# Patient Record
Sex: Male | Born: 1950 | Race: White | Hispanic: No | Marital: Married | Smoking: Former smoker
Health system: Southern US, Community
[De-identification: ages and names within clinical notes are randomized; demographics above are authoritative.]

## PROBLEM LIST (undated history)

## (undated) DIAGNOSIS — K8020 Calculus of gallbladder without cholecystitis without obstruction: Secondary | ICD-10-CM

## (undated) DIAGNOSIS — Z860101 Personal history of adenomatous and serrated colon polyps: Secondary | ICD-10-CM

## (undated) DIAGNOSIS — Z8619 Personal history of other infectious and parasitic diseases: Secondary | ICD-10-CM

## (undated) DIAGNOSIS — M1990 Unspecified osteoarthritis, unspecified site: Secondary | ICD-10-CM

## (undated) DIAGNOSIS — K219 Gastro-esophageal reflux disease without esophagitis: Secondary | ICD-10-CM

## (undated) DIAGNOSIS — Z86010 Personal history of colonic polyps: Secondary | ICD-10-CM

## (undated) DIAGNOSIS — Z8719 Personal history of other diseases of the digestive system: Secondary | ICD-10-CM

## (undated) DIAGNOSIS — Z87440 Personal history of urinary (tract) infections: Secondary | ICD-10-CM

## (undated) DIAGNOSIS — K449 Diaphragmatic hernia without obstruction or gangrene: Secondary | ICD-10-CM

## (undated) HISTORY — DX: Gastro-esophageal reflux disease without esophagitis: K21.9

## (undated) HISTORY — PX: COLONOSCOPY WITH ESOPHAGOGASTRODUODENOSCOPY (EGD): SHX5779

---
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
## 1987-07-02 HISTORY — PX: CERVICAL DISC SURGERY: SHX588

## 2013-11-17 ENCOUNTER — Emergency Department (HOSPITAL_COMMUNITY)
Admission: EM | Admit: 2013-11-17 | Discharge: 2013-11-17 | Disposition: A | Discharge status: Home / Self Care | Payer: BC Managed Care – PPO | Attending: Emergency Medicine | Admitting: Emergency Medicine

## 2013-11-17 ENCOUNTER — Emergency Department (HOSPITAL_COMMUNITY): Payer: BC Managed Care – PPO

## 2013-11-17 ENCOUNTER — Encounter (HOSPITAL_COMMUNITY): Payer: Self-pay | Admitting: Emergency Medicine

## 2013-11-17 DIAGNOSIS — Y9239 Other specified sports and athletic area as the place of occurrence of the external cause: Secondary | ICD-10-CM | POA: Insufficient documentation

## 2013-11-17 DIAGNOSIS — X500XXA Overexertion from strenuous movement or load, initial encounter: Secondary | ICD-10-CM | POA: Insufficient documentation

## 2013-11-17 DIAGNOSIS — Y92838 Other recreation area as the place of occurrence of the external cause: Secondary | ICD-10-CM

## 2013-11-17 DIAGNOSIS — Y9301 Activity, walking, marching and hiking: Secondary | ICD-10-CM | POA: Insufficient documentation

## 2013-11-17 DIAGNOSIS — S82843A Displaced bimalleolar fracture of unspecified lower leg, initial encounter for closed fracture: Secondary | ICD-10-CM | POA: Insufficient documentation

## 2013-11-17 DIAGNOSIS — S82891A Other fracture of right lower leg, initial encounter for closed fracture: Secondary | ICD-10-CM

## 2013-11-17 MED ORDER — OXYCODONE-ACETAMINOPHEN 5-325 MG PO TABS
2.0000 | ORAL_TABLET | Freq: Once | ORAL | Status: AC
  Administered 2013-11-17: 2 via ORAL
  Filled 2013-11-17 (×2): qty 2

## 2013-11-17 MED ORDER — OXYCODONE-ACETAMINOPHEN 5-325 MG PO TABS: 1.0000 | ORAL_TABLET | ORAL | Status: DC | PRN

## 2013-11-17 NOTE — Discharge Instructions (Signed)
Ankle Fracture A fracture is a break in the bone. A cast or splint is used to protect and keep your injured bone from moving.  HOME CARE INSTRUCTIONS   Use your crutches as directed.  To lessen the swelling, keep the injured leg elevated while sitting or lying down.  Apply ice to the injury for 15-20 minutes, 03-04 times per day while awake for 2 days. Put the ice in a plastic bag and place a thin towel between the bag of ice and your cast.  If you have a plaster or fiberglass cast:  Do not try to scratch the skin under the cast using sharp or pointed objects.  Check the skin around the cast every day. You may put lotion on any red or sore areas.  Keep your cast dry and clean.  If you have a plaster splint:  Wear the splint as directed.  You may loosen the elastic around the splint if your toes become numb, tingle, or turn cold or blue.  Do not put pressure on any part of your cast or splint; it may break. Rest your cast only on a pillow the first 24 hours until it is fully hardened.  Your cast or splint can be protected during bathing with a plastic bag. Do not lower the cast or splint into water.  Take medications as directed by your caregiver. Only take over-the-counter or prescription medicines for pain, discomfort, or fever as directed by your caregiver.  Do not drive a vehicle until your caregiver specifically tells you it is safe to do so.  If your caregiver has given you a follow-up appointment, it is very important to keep that appointment. Not keeping the appointment could result in a chronic or permanent injury, pain, and disability. If there is any problem keeping the appointment, you must call back to this facility for assistance. SEEK IMMEDIATE MEDICAL CARE IF:   Your cast gets damaged or breaks.  You have continued severe pain or more swelling than you did before the cast was put on.  Your skin or toenails below the injury turn blue or gray, or feel cold or  numb.  There is a bad smell or new stains and/or purulent (pus like) drainage coming from under the cast. If you do not have a window in your cast for observing the wound, a discharge or minor bleeding may show up as a stain on the outside of your cast. Report these findings to your caregiver. MAKE SURE YOU:   Understand these instructions.  Will watch your condition.  Will get help right away if you are not doing well or get worse. Document Released: 06/14/2000 Document Revised: 09/09/2011 Document Reviewed: 01/14/2013 [facility] Patient Information 2014 [place], [place].  Cast or Splint Care Casts and splints support injured limbs and keep bones from moving while they heal. It is important to care for your cast or splint at home.  HOME CARE INSTRUCTIONS  Keep the cast or splint uncovered during the drying period. It can take 24 to 48 hours to dry if it is made of plaster. A fiberglass cast will dry in less than 1 hour.  Do not rest the cast on anything harder than a pillow for the first 24 hours.  Do not put weight on your injured limb or apply pressure to the cast until your health care provider gives you permission.  Keep the cast or splint dry. Wet casts or splints can lose their shape and may not support the limb  as well. A wet cast that has lost its shape can also create harmful pressure on your skin when it dries. Also, wet skin can become infected.  Cover the cast or splint with a plastic bag when bathing or when out in the rain or snow. If the cast is on the trunk of the body, take sponge baths until the cast is removed.  If your cast does become wet, dry it with a towel or a blow dryer on the cool setting only.  Keep your cast or splint clean. Soiled casts may be wiped with a moistened cloth.  Do not place any hard or soft foreign objects under your cast or splint, such as cotton, toilet paper, lotion, or powder.  Do not try to scratch the skin under the cast with any  object. The object could get stuck inside the cast. Also, scratching could lead to an infection. If itching is a problem, use a blow dryer on a cool setting to relieve discomfort.  Do not trim or cut your cast or remove padding from inside of it.  Exercise all joints next to the injury that are not immobilized by the cast or splint. For example, if you have a long leg cast, exercise the hip joint and toes. If you have an arm cast or splint, exercise the shoulder, elbow, thumb, and fingers.  Elevate your injured arm or leg on 1 or 2 pillows for the first 1 to 3 days to decrease swelling and pain.It is best if you can comfortably elevate your cast so it is higher than your heart. SEEK MEDICAL CARE IF:   Your cast or splint cracks.  Your cast or splint is too tight or too loose.  You have unbearable itching inside the cast.  Your cast becomes wet or develops a soft spot or area.  You have a bad smell coming from inside your cast.  You get an object stuck under your cast.  Your skin around the cast becomes red or raw.  You have new pain or worsening pain after the cast has been applied. SEEK IMMEDIATE MEDICAL CARE IF:   You have fluid leaking through the cast.  You are unable to move your fingers or toes.  You have discolored (blue or white), cool, painful, or very swollen fingers or toes beyond the cast.  You have tingling or numbness around the injured area.  You have severe pain or pressure under the cast.  You have any difficulty with your breathing or have shortness of breath.  You have chest pain. Document Released: 06/14/2000 Document Revised: 04/07/2013 Document Reviewed: 12/24/2012 [facility] Patient Information 2014 [place]. RICE: Routine Care for Injuries The routine care of many injuries includes Rest, Ice, Compression, and Elevation (RICE). HOME CARE INSTRUCTIONS  Rest is needed to allow your body to heal. Routine activities can usually be resumed when  comfortable. Injured tendons and bones can take up to 6 weeks to heal. Tendons are the cord-like structures that attach muscle to bone.  Ice following an injury helps keep the swelling down and reduces pain.  Put ice in a plastic bag.  Place a towel between your skin and the bag.  Leave the ice on for 15-20 minutes, 03-04 times a day. Do this while awake, for the first 24 to 48 hours. After that, continue as directed by your caregiver.  Compression helps keep swelling down. It also gives support and helps with discomfort. If an elastic bandage has been applied, it should  be removed and reapplied every 3 to 4 hours. It should not be applied tightly, but firmly enough to keep swelling down. Watch fingers or toes for swelling, bluish discoloration, coldness, numbness, or excessive pain. If any of these problems occur, remove the bandage and reapply loosely. Contact your caregiver if these problems continue.  Elevation helps reduce swelling and decreases pain. With extremities, such as the arms, hands, legs, and feet, the injured area should be placed near or above the level of the heart, if possible. SEEK IMMEDIATE MEDICAL CARE IF:  You have persistent pain and swelling.  You develop redness, numbness, or unexpected weakness.  Your symptoms are getting worse rather than improving after several days. These symptoms may indicate that further evaluation or further X-rays are needed. Sometimes, X-rays may not show a small broken bone (fracture) until 1 week or 10 days later. Make a follow-up appointment with your caregiver. Ask when your X-ray results will be ready. Make sure you get your X-ray results. Document Released: 09/29/2000 Document Revised: 09/09/2011 Document Reviewed: 11/16/2010 [facility] Patient Information 2014 [place], [place].

## 2013-11-17 NOTE — ED Provider Notes (Signed)
TIME SEEN: 9:40 PM  CHIEF COMPLAINT: Right ankle injury  HPI: Patient is a 63 year old male with no significant past medical history who presents to the emergency department with a right ankle injury. He reports that he either at his right ankle when walking down his posterior is approximately one hour ago. He did not fall or hit his head. No other injury. No numbness or focal weakness.  ROS: See HPI Constitutional: no fever  Eyes: no drainage  ENT: no runny nose   Cardiovascular:  no chest pain  Resp: no SOB  GI: no vomiting GU: no dysuria Integumentary: no rash  Allergy: no hives  Musculoskeletal: no leg swelling  Neurological: no slurred speech ROS otherwise negative  PAST MEDICAL HISTORY/PAST SURGICAL HISTORY:  History reviewed. No pertinent past medical history.  MEDICATIONS:  Prior to Admission medications   Medication Sig Start Date End Date Taking? Authorizing Provider  naproxen sodium (ANAPROX) 220 MG tablet Take 220 mg by mouth 2 (two) times daily as needed (pain).   Yes Historical Provider, MD    ALLERGIES:  No Known Allergies  SOCIAL HISTORY:  History  Substance Use Topics  . Smoking status: Never Smoker   . Smokeless tobacco: Not on file  . Alcohol Use: Yes     Comment: nightly    FAMILY HISTORY: No family history on file.  EXAM: BP 183/91  Pulse 77  Temp(Src) 98.6 F (37 C) (Oral)  Resp 21  SpO2 97% CONSTITUTIONAL: Alert and oriented and responds appropriately to questions. Well-appearing; well-nourished HEAD: Normocephalic EYES: Conjunctivae clear, PERRL ENT: normal nose; no rhinorrhea; moist mucous membranes; pharynx without lesions noted NECK: Supple, no meningismus, no LAD  CARD: RRR; S1 and S2 appreciated; no murmurs, no clicks, no rubs, no gallops RESP: Normal chest excursion without splinting or tachypnea; breath sounds clear and equal bilaterally; no wheezes, no rhonchi, no rales,  ABD/GI: Normal bowel sounds; non-distended; soft,  non-tender, no rebound, no guarding BACK:  The back appears normal and is non-tender to palpation, there is no CVA tenderness EXT: Patient is tender to palpation over his right lateral and medial malleoli with swelling and minimal ecchymosis, 2+ DP pulses bilaterally, sensation to light-touch intact diffusely, no tenderness to palpation at the proximal fibular head, normal range of motion in the right toes and knee and has, otherwise Normal ROM in all joints; otherwise extremities are non-tender to palpation; no edema; normal capillary refill; no cyanosis    SKIN: Normal color for age and race; warm NEURO: Moves all extremities equally PSYCH: The patient's mood and manner are appropriate. Grooming and personal hygiene are appropriate.  MEDICAL DECISION MAKING: Patient here with right ankle injury. His x-ray shows a comminuted fracture of the distal fibula and lateral malleolus with an avulsion fracture of the medial malleolus with lateral displacement. There is intra-articular extension and widening of the ankle mortise. Discussed with Dr. Tonita Cong with orthopedics who agrees with a posterior splint with stirrups and discharge home with supportive care instructions and orthopedic followup. He is neurovascularly intact distally. Patient and wife agree with this plan. Have discussed return precautions.  SPLINT APPLICATION Date/Time: 57:32 PM Authorized by: Delice Bison Ivis Nicolson Consent: Verbal consent obtained. Risks and benefits: risks, benefits and alternatives were discussed Consent given by: patient Splint applied by: orthopedic technician Location details: Right ankle  Splint type: Posterior splint with stirrups  Supplies used: Fiberglass and Ace bandage  Post-procedure: The splinted body part was neurovascularly unchanged following the procedure. Patient tolerance: Patient tolerated the  procedure well with no immediate complications.       [place], DO 11/17/13 2222

## 2013-11-17 NOTE — ED Notes (Signed)
Pt presents with swelling to R ankle, pt states he twisted ankle while walking down pool steps @ 1 hour ago.

## 2013-11-19 ENCOUNTER — Encounter (HOSPITAL_COMMUNITY): Payer: Self-pay | Admitting: Pharmacy Technician

## 2013-11-19 ENCOUNTER — Other Ambulatory Visit: Payer: Self-pay | Admitting: Orthopedic Surgery

## 2013-11-19 NOTE — Progress Notes (Signed)
Lacie Draft, PA  -  Please enter preop orders in epic for Arjen Deringer  - surgery date is 5/27 and pt is coming 5/26 for preop at [facility].  Thanks.

## 2013-11-19 NOTE — Patient Instructions (Addendum)
Derek Browning  11/19/2013   Your procedure is scheduled on:  11/24/2013  100pm-230pm  Report to [facility] at    1100  AM.  Call this number if you have problems the morning of surgery: [phone]   Remember:   Do not eat food after midnite.              May have clear liquids until 0700am then npo   Take these medicines the morning of surgery with A SIP OF WATER:    Do not wear jewelry,   Do not wear lotions, powders, or perfumes.    Men may shave face and neck.  Do not bring valuables to the hospital.  Contacts, dentures or bridgework may not be worn into surgery.  Leave suitcase in the car. After surgery it may be brought to your room.  For patients admitted to the hospital, checkout time is 11:00 AM the day of  discharge.   :      Please read over the following fact sheets that you were given   [name] Excluded  Coffee and tea, regular and decaf                             liquids that you cannot  Plain Jell-O in any flavor                                             see through such as: Fruit ices (not with fruit pulp)                                     milk, soups, orange juice  Iced Popsicles                                    All solid food Carbonated beverages, regular and diet                                    Cranberry, grape and apple juices Sports drinks like Gatorade Lightly seasoned clear broth or consume(fat free) Sugar, honey syrup  Sample Menu Breakfast                                Lunch                                     Supper Cranberry juice                    Beef broth  Chicken broth Jell-O                                     Grape juice                           Apple juice Coffee or tea                        Jell-O                                      Popsicle                                                 Coffee or tea                        Coffee or tea  _____________________________________________________________________  [facility] - Preparing for Surgery Before surgery, you can play an important role.  Because skin is not sterile, your skin needs to be as free of germs as possible.  You can reduce the number of germs on your skin by washing with CHG (chlorahexidine gluconate) soap before surgery.  CHG is an antiseptic cleaner which kills germs and bonds with the skin to continue killing germs even after washing. Please DO NOT use if you have an allergy to CHG or antibacterial soaps.  If your skin becomes reddened/irritated stop using the CHG and inform your nurse when you arrive at Short Stay. Do not shave (including legs and underarms) for at least 48 hours prior to the first CHG shower.  You may shave your face/neck. Please follow these instructions carefully:  1.  Shower with CHG Soap the night before surgery and the  morning of Surgery.  2.  If you choose to wash your hair, wash your hair first as usual with your  normal  shampoo.  3.  After you shampoo, rinse your hair and body thoroughly to remove the  shampoo.                           4.  Use CHG as you would any other liquid soap.  You can apply chg directly  to the skin and wash                       Gently with a scrungie or clean washcloth.  5.  Apply the CHG Soap to your body ONLY FROM THE NECK DOWN.   Do not use on face/ open                           Wound or open sores. Avoid contact with eyes, ears mouth and genitals (private parts).                       Wash face,  Genitals (private parts) with your normal soap.             6.  Wash thoroughly, paying special attention to the area  where your surgery  will be performed.  7.  Thoroughly rinse your body with warm water from the neck down.  8.  DO NOT shower/wash with your normal soap after using and rinsing off  the CHG Soap.                9.  Pat yourself dry with a  clean towel.            10.  Wear clean pajamas.            11.  Place clean sheets on your bed the night of your first shower and do not  sleep with pets. Day of Surgery : Do not apply any lotions/deodorants the morning of surgery.  Please wear clean clothes to the hospital/surgery center.  FAILURE TO FOLLOW THESE INSTRUCTIONS MAY RESULT IN THE CANCELLATION OF YOUR SURGERY PATIENT SIGNATURE_________________________________  NURSE SIGNATURE__________________________________  ________________________________________________________________________   Derek Browning  An incentive spirometer is a tool that can help keep your lungs clear and active. This tool measures how well you are filling your lungs with each breath. Taking long deep breaths may help reverse or decrease the chance of developing breathing (pulmonary) problems (especially infection) following:  A long period of time when you are unable to move or be active. BEFORE THE PROCEDURE   If the spirometer includes an indicator to show your best effort, your nurse or respiratory therapist will set it to a desired goal.  If possible, sit up straight or lean slightly forward. Try not to slouch.  Hold the incentive spirometer in an upright position. INSTRUCTIONS FOR USE  1. Sit on the edge of your bed if possible, or sit up as far as you can in bed or on a chair. 2. Hold the incentive spirometer in an upright position. 3. Breathe out normally. 4. Place the mouthpiece in your mouth and seal your lips tightly around it. 5. Breathe in slowly and as deeply as possible, raising the piston or the ball toward the top of the column. 6. Hold your breath for 3-5 seconds or for as long as possible. Allow the piston or ball to fall to the bottom of the column. 7. Remove the mouthpiece from your mouth and breathe out normally. 8. Rest for a few seconds and repeat Steps 1 through 7 at least 10 times every 1-2 hours when you are awake.  Take your time and take a few normal breaths between deep breaths. 9. The spirometer may include an indicator to show your best effort. Use the indicator as a goal to work toward during each repetition. 10. After each set of 10 deep breaths, practice coughing to be sure your lungs are clear. If you have an incision (the cut made at the time of surgery), support your incision when coughing by placing a pillow or rolled up towels firmly against it. Once you are able to get out of bed, walk around indoors and cough well. You may stop using the incentive spirometer when instructed by your caregiver.  RISKS AND COMPLICATIONS  Take your time so you do not get dizzy or light-headed.  If you are in pain, you may need to take or ask for pain medication before doing incentive spirometry. It is harder to take a deep breath if you are having pain. AFTER USE  Rest and breathe slowly and easily.  It can be helpful to keep track of a log of your progress. Your caregiver can provide you with a simple  table to help with this. If you are using the spirometer at home, follow these instructions: [place] IF:   You are having difficultly using the spirometer.  You have trouble using the spirometer as often as instructed.  Your pain medication is not giving enough relief while using the spirometer.  You develop fever of 100.5 F (38.1 C) or higher. SEEK IMMEDIATE MEDICAL CARE IF:   You cough up bloody sputum that had not been present before.  You develop fever of 102 F (38.9 C) or greater.  You develop worsening pain at or near the incision site. MAKE SURE YOU:   Understand these instructions.  Will watch your condition.  Will get help right away if you are not doing well or get worse. Document Released: 10/28/2006 Document Revised: 09/09/2011 Document Reviewed: 12/29/2006 [employer] Patient Information 2014 [place].   ________________________________________________________________________ , coughing and deep breathing exercises, leg exercises

## 2013-11-19 NOTE — H&P (Signed)
Colon Flattery DOB: Mar 13, 1951 Married / Language: English / Race: White Male  Chief complaint: R ankle pain  History of Present Illness The patient is a 63 year old male who presents with ankle complaints. The patient reports right ankle symptoms including: pain and swelling which began 1 day(s) ago following a specific injury. The injury occurred due to a fall (while coming down the stairs). Symptoms are reported to be located in the right ankle. Symptoms are exacerbated by weight bearing. Current treatment includes crutches (non-weight bearing) (and splinting). Prior to being seen today the patient was previously evaluated in the emergency room (W-L). Previous work-up for this problem has included ankle x-rays. Note for "Ankle pain": Halim presents today with his wife. He injured his R ankle yesterday coming down his pool steps, came down wrong on it at the bottom of the steps, and rotated it. He had instant pain and presented to the ER where xrays revealed a bimalleolar ankle fx. He was placed in a padded splint and reports he is comfortable. He did not yet fill his pain medication, was given Rx for Percocet. He is on crutches. He denies numbness or tingling. Denies prior injury to this ankle or other injuries associated with this. He is an avid motorcycle rider and has a trip planned to [facility] in August.  Allergies No Known Drug Allergies. 11/18/2013  Family History First Degree Relatives. reported No pertinent family history  Social History Children. 2 Current drinker. 11/18/2013: Currently drinks beer only occasionally per week Current work status. working full time Exercise. Exercises daily; does other Living situation. live with spouse Marital status. married No history of drug/alcohol rehab Not under pain contract Number of flights of stairs before winded. 4-5 Tobacco / smoke exposure. 11/18/2013: no Tobacco use. Former smoker. 11/18/2013: smoke(d) 1  pack(s) per day  Past Surgical History Appendectomy Neck Disc Surgery  Past Medical Hx No pertinent past medical history  Review of Systems General:Not Present- Chills, Fever, Night Sweats, Appetite Loss, Fatigue, Feeling sick, Weight Gain and Weight Loss. Skin:Not Present- Itching, Rash, Skin Color Changes, Ulcer, Psoriasis and Change in Hair or Nails. HEENT:Not Present- Sensitivity to light, Hearing problems, Nose Bleed and Ringing in the Ears. Neck:Not Present- Swollen Glands and Neck Mass. Respiratory:Not Present- Snoring, Chronic Cough, Bloody sputum and Dyspnea. Cardiovascular:Not Present- Shortness of Breath, Chest Pain, Swelling of Extremities, Leg Cramps and Palpitations. Gastrointestinal:Not Present- Bloody Stool, Heartburn, Abdominal Pain, Vomiting, Nausea and Incontinence of Stool. Male Genitourinary:Not Present- Blood in Urine, Menstrual Irregularities, Frequency, Incontinence and Nocturia. Musculoskeletal:Present- Joint Stiffness, Joint Swelling and Joint Pain. Not Present- Muscle Weakness, Muscle Pain and Back Pain. Neurological:Not Present- Tingling, Numbness, Burning, Tremor, Headaches and Dizziness. Psychiatric:Not Present- Anxiety, Depression and Memory Loss. Endocrine:Not Present- Cold Intolerance, Heat Intolerance, Excessive hunger and Excessive Thirst. Hematology:Not Present- Abnormal Bleeding, Anemia, Blood Clots and Easy Bruising.  Vitals 11/18/2013 1:47 PM Weight: 160 lb Height: 66 in Body Surface Area: 1.84 m Body Mass Index: 25.82 kg/m BP: 180/88 (Sitting, Left Arm, Standard)  Physical Exam The physical exam findings are as follows:  General Mental Status - Alert. General Appearance- pleasant. Not in acute distress. Orientation- Oriented X3. Build & Nutrition- Well nourished and Well developed. Gait- Use of assistive device (crutches). Mental Status- Alert.  Peripheral Vascular Lower  Extremity: Palpation:Tenderness- Right- no calf tenderness to palpation. Posterior tibial pulse- Bilateral- 2+. Dorsalis pedis pulse- Bilateral- 2+.  Neurologic Sensation:Lower Extremity- Right- sensation is intact in the lower extremity, unless otherwise mentioned.  Musculoskeletal Lower Extremity  Right Lower Extremity: Right Ankle: Inspection and Palpation:Tenderness- lateral malleolus tender to palpation and medial malleolus tender to palpation. no tenderness to palpation of the Achilles tendon, no tenderness to palpation of the base of the 5th metatarsal and no tenderness to palpation of the calf. Swelling- mild. Effusion- none. Tissue tension/texture is - soft. Sensation is - normal. MBW:GYKZLDJ limited- Note: known ankle fx, ROM and strength not evaluated Right Foot: Inspection and Palpation:Tenderness- no tenderness to palpation. Swelling- mild.  Imaging xrays R ankle from cone system reviewed today by Dr. Tonita Cong with bimalleolar ankle fx, comminuted lateral malleolus fx and avulsed fragment from the medial malleolus. There is widening of the mortise noted.  Assessment & Plan Closed bimalleolar fracture of right ankle  Pt with R ankle bimalleolar fx s/p rotational injury x 1 day ago. We discussed the nature of his injury, due to mortise widening and instability of his fracture, recommend proceeding with surgery, ORIF R ankle. We discussed the procedure itself as well as risks, complications, and alternatives including but not limited to DVT, PE, infx, bleeding, failure of procedure, need for secondary procedure, anesthesia risk, even death. Discussed post-op protocols, time out of work, restricted weightbearing, need for DVT ppx, immobilization, restrictions. He is otherwise healthy, has no hx of MRSA or DVT, does not require clearance. We will proceed accordingly with scheduling surgery, all his questions were answered, and he desires to proceed. In the interim,  recommend continued aggressive ice and elevation, toes above the nose, to reduce swelling. Remain in splint, he was instructed to call us if it becomes too tight. He should remain NWB, using crutches, was also given Rx for kneeling scooter. He does have Rx for pain meds at home which he has not filled yet but may take as needed. Will plan tentatively to schedule for next week for ORIF. He will call with any questions in the interim but will follow up 10-14 days post-op for staple removal and xrays.  Plan R ankle ORIF  Signed electronically by Lacie Draft PA-C for Dr. Tonita Cong

## 2013-11-23 ENCOUNTER — Encounter (HOSPITAL_COMMUNITY): Payer: Self-pay

## 2013-11-23 ENCOUNTER — Encounter (HOSPITAL_COMMUNITY)
Admission: RE | Admit: 2013-11-23 | Discharge: 2013-11-23 | Disposition: A | Discharge status: Home / Self Care | Payer: BC Managed Care – PPO | Source: Ambulatory Visit | Attending: Specialist | Admitting: Specialist

## 2013-11-23 HISTORY — DX: Unspecified osteoarthritis, unspecified site: M19.90

## 2013-11-23 LAB — CBC
HCT: 42.9 % (ref 39.0–52.0)
Hemoglobin: 14.7 g/dL (ref 13.0–17.0)
MCH: 29.7 pg (ref 26.0–34.0)
MCHC: 34.3 g/dL (ref 30.0–36.0)
MCV: 86.7 fL (ref 78.0–100.0)
Platelets: 267 10*3/uL (ref 150–400)
RBC: 4.95 MIL/uL (ref 4.22–5.81)
RDW: 13 % (ref 11.5–15.5)
WBC: 7.2 10*3/uL (ref 4.0–10.5)

## 2013-11-23 LAB — COMPREHENSIVE METABOLIC PANEL
ALT: 24 U/L (ref 0–53)
AST: 27 U/L (ref 0–37)
Albumin: 4.1 g/dL (ref 3.5–5.2)
Alkaline Phosphatase: 47 U/L (ref 39–117)
BUN: 14 mg/dL (ref 6–23)
CHLORIDE: 96 meq/L (ref 96–112)
CO2: 28 meq/L (ref 19–32)
CREATININE: 1.05 mg/dL (ref 0.50–1.35)
Calcium: 10 mg/dL (ref 8.4–10.5)
GFR calc Af Amer: 85 mL/min — ABNORMAL LOW (ref >90)
GFR, EST NON AFRICAN AMERICAN: 73 mL/min — AB (ref >90)
GLUCOSE: 100 mg/dL — AB (ref 70–99)
Potassium: 5 mEq/L (ref 3.7–5.3)
Sodium: 136 mEq/L — ABNORMAL LOW (ref 137–147)
Total Bilirubin: 0.5 mg/dL (ref 0.3–1.2)
Total Protein: 7.8 g/dL (ref 6.0–8.3)

## 2013-11-24 ENCOUNTER — Encounter (HOSPITAL_COMMUNITY)
Admission: RE | Disposition: A | Discharge status: Home / Self Care | Payer: Self-pay | Source: Ambulatory Visit | Attending: Specialist

## 2013-11-24 ENCOUNTER — Ambulatory Visit (HOSPITAL_COMMUNITY): Payer: BC Managed Care – PPO

## 2013-11-24 ENCOUNTER — Ambulatory Visit (HOSPITAL_COMMUNITY)
Admission: RE | Admit: 2013-11-24 | Discharge: 2013-11-25 | Disposition: A | Discharge status: Home / Self Care | Payer: BC Managed Care – PPO | Source: Ambulatory Visit | Attending: Specialist | Admitting: Specialist

## 2013-11-24 ENCOUNTER — Ambulatory Visit (HOSPITAL_COMMUNITY): Payer: BC Managed Care – PPO | Admitting: Anesthesiology

## 2013-11-24 ENCOUNTER — Encounter (HOSPITAL_COMMUNITY): Payer: BC Managed Care – PPO | Admitting: Anesthesiology

## 2013-11-24 ENCOUNTER — Encounter (HOSPITAL_COMMUNITY): Payer: Self-pay | Admitting: *Deleted

## 2013-11-24 DIAGNOSIS — Z87891 Personal history of nicotine dependence: Secondary | ICD-10-CM | POA: Insufficient documentation

## 2013-11-24 DIAGNOSIS — S82843A Displaced bimalleolar fracture of unspecified lower leg, initial encounter for closed fracture: Secondary | ICD-10-CM | POA: Insufficient documentation

## 2013-11-24 DIAGNOSIS — S82841A Displaced bimalleolar fracture of right lower leg, initial encounter for closed fracture: Secondary | ICD-10-CM

## 2013-11-24 DIAGNOSIS — Z01812 Encounter for preprocedural laboratory examination: Secondary | ICD-10-CM | POA: Insufficient documentation

## 2013-11-24 DIAGNOSIS — Z9089 Acquired absence of other organs: Secondary | ICD-10-CM | POA: Insufficient documentation

## 2013-11-24 DIAGNOSIS — W108XXA Fall (on) (from) other stairs and steps, initial encounter: Secondary | ICD-10-CM | POA: Insufficient documentation

## 2013-11-24 HISTORY — PX: ORIF ANKLE FRACTURE: SHX5408

## 2013-11-24 SURGERY — OPEN REDUCTION INTERNAL FIXATION (ORIF) ANKLE FRACTURE
Anesthesia: General | Site: Ankle | Laterality: Right

## 2013-11-24 MED ORDER — LACTATED RINGERS IV SOLN
INTRAVENOUS | Status: DC
Start: 2013-11-24 — End: 2013-11-24
  Administered 2013-11-24: 15:00:00 via INTRAVENOUS
  Administered 2013-11-24: 1000 mL via INTRAVENOUS

## 2013-11-24 MED ORDER — ONDANSETRON HCL 4 MG/2ML IJ SOLN: 4.0000 mg | Freq: Four times a day (QID) | INTRAMUSCULAR | Status: DC | PRN

## 2013-11-24 MED ORDER — FENTANYL CITRATE 0.05 MG/ML IJ SOLN
INTRAMUSCULAR | Status: DC | PRN
  Administered 2013-11-24: 50 ug via INTRAVENOUS
  Administered 2013-11-24: 100 ug via INTRAVENOUS
  Administered 2013-11-24: 50 ug via INTRAVENOUS

## 2013-11-24 MED ORDER — METHOCARBAMOL 500 MG PO TABS: 500.0000 mg | ORAL_TABLET | Freq: Four times a day (QID) | ORAL | Status: DC | PRN

## 2013-11-24 MED ORDER — POLYETHYLENE GLYCOL 3350 17 G PO PACK: 17.0000 g | PACK | Freq: Every day | ORAL | Status: DC | PRN

## 2013-11-24 MED ORDER — METHOCARBAMOL 1000 MG/10ML IJ SOLN
500.0000 mg | Freq: Four times a day (QID) | INTRAVENOUS | Status: DC | PRN
  Administered 2013-11-24: 500 mg via INTRAVENOUS
  Filled 2013-11-24: qty 5

## 2013-11-24 MED ORDER — CEFAZOLIN SODIUM-DEXTROSE 2-3 GM-% IV SOLR
INTRAVENOUS | Status: AC
Start: 2013-11-24 — End: 2013-11-24
  Filled 2013-11-24: qty 50

## 2013-11-24 MED ORDER — ONDANSETRON HCL 4 MG PO TABS: 4.0000 mg | ORAL_TABLET | Freq: Four times a day (QID) | ORAL | Status: DC | PRN

## 2013-11-24 MED ORDER — HYDROMORPHONE HCL PF 1 MG/ML IJ SOLN: 0.5000 mg | INTRAMUSCULAR | Status: DC | PRN

## 2013-11-24 MED ORDER — MIDAZOLAM HCL 5 MG/5ML IJ SOLN
INTRAMUSCULAR | Status: DC | PRN
  Administered 2013-11-24: 2 mg via INTRAVENOUS

## 2013-11-24 MED ORDER — MAGNESIUM CITRATE PO SOLN: 1.0000 | Freq: Once | ORAL | Status: AC | PRN

## 2013-11-24 MED ORDER — DOCUSATE SODIUM 100 MG PO CAPS
100.0000 mg | ORAL_CAPSULE | Freq: Two times a day (BID) | ORAL | Status: DC
  Administered 2013-11-25: 100 mg via ORAL

## 2013-11-24 MED ORDER — CEFAZOLIN SODIUM-DEXTROSE 2-3 GM-% IV SOLR
2.0000 g | Freq: Four times a day (QID) | INTRAVENOUS | Status: AC
  Administered 2013-11-24 – 2013-11-25 (×3): 2 g via INTRAVENOUS
  Filled 2013-11-24 (×3): qty 50

## 2013-11-24 MED ORDER — DIPHENHYDRAMINE HCL 12.5 MG/5ML PO ELIX: 12.5000 mg | ORAL_SOLUTION | ORAL | Status: DC | PRN

## 2013-11-24 MED ORDER — DEXAMETHASONE SODIUM PHOSPHATE 10 MG/ML IJ SOLN
INTRAMUSCULAR | Status: AC
  Filled 2013-11-24: qty 1

## 2013-11-24 MED ORDER — LIDOCAINE HCL (CARDIAC) 20 MG/ML IV SOLN
INTRAVENOUS | Status: DC | PRN
  Administered 2013-11-24: 100 mg via INTRAVENOUS

## 2013-11-24 MED ORDER — LACTATED RINGERS IV SOLN: INTRAVENOUS | Status: DC

## 2013-11-24 MED ORDER — FENTANYL CITRATE 0.05 MG/ML IJ SOLN
INTRAMUSCULAR | Status: AC
  Filled 2013-11-24: qty 5

## 2013-11-24 MED ORDER — PROPOFOL 10 MG/ML IV BOLUS
INTRAVENOUS | Status: DC | PRN
  Administered 2013-11-24: 180 mg via INTRAVENOUS

## 2013-11-24 MED ORDER — ONDANSETRON HCL 4 MG/2ML IJ SOLN
INTRAMUSCULAR | Status: AC
  Filled 2013-11-24: qty 2

## 2013-11-24 MED ORDER — OXYCODONE-ACETAMINOPHEN 5-325 MG PO TABS
1.0000 | ORAL_TABLET | ORAL | Status: DC | PRN
  Administered 2013-11-25: 1 via ORAL
  Filled 2013-11-24: qty 1

## 2013-11-24 MED ORDER — SODIUM CHLORIDE 0.9 % IR SOLN
Status: DC | PRN
  Administered 2013-11-24: 16:00:00

## 2013-11-24 MED ORDER — MIDAZOLAM HCL 2 MG/2ML IJ SOLN
INTRAMUSCULAR | Status: AC
  Filled 2013-11-24: qty 2

## 2013-11-24 MED ORDER — KETOROLAC TROMETHAMINE 30 MG/ML IJ SOLN
30.0000 mg | Freq: Four times a day (QID) | INTRAMUSCULAR | Status: DC
  Administered 2013-11-24 – 2013-11-25 (×3): 30 mg via INTRAVENOUS
  Filled 2013-11-24 (×5): qty 1

## 2013-11-24 MED ORDER — PROPOFOL 10 MG/ML IV BOLUS
INTRAVENOUS | Status: AC
  Filled 2013-11-24: qty 20

## 2013-11-24 MED ORDER — PROMETHAZINE HCL 25 MG/ML IJ SOLN
INTRAMUSCULAR | Status: AC
  Filled 2013-11-24: qty 1

## 2013-11-24 MED ORDER — METOCLOPRAMIDE HCL 10 MG PO TABS: 5.0000 mg | ORAL_TABLET | Freq: Three times a day (TID) | ORAL | Status: DC | PRN

## 2013-11-24 MED ORDER — EPHEDRINE SULFATE 50 MG/ML IJ SOLN
INTRAMUSCULAR | Status: AC
Start: 2013-11-24 — End: 2013-11-24
  Filled 2013-11-24: qty 1

## 2013-11-24 MED ORDER — HYDROCODONE-ACETAMINOPHEN 5-325 MG PO TABS: 1.0000 | ORAL_TABLET | ORAL | Status: DC | PRN

## 2013-11-24 MED ORDER — GLYCOPYRROLATE 0.2 MG/ML IJ SOLN
INTRAMUSCULAR | Status: AC
  Filled 2013-11-24: qty 2

## 2013-11-24 MED ORDER — DEXAMETHASONE SODIUM PHOSPHATE 10 MG/ML IJ SOLN
INTRAMUSCULAR | Status: DC | PRN
  Administered 2013-11-24: 10 mg via INTRAVENOUS

## 2013-11-24 MED ORDER — LABETALOL HCL 5 MG/ML IV SOLN
5.0000 mg | INTRAVENOUS | Status: AC
  Administered 2013-11-24 (×4): 5 mg via INTRAVENOUS

## 2013-11-24 MED ORDER — PROMETHAZINE HCL 25 MG/ML IJ SOLN
6.2500 mg | INTRAMUSCULAR | Status: DC | PRN
  Administered 2013-11-24: 6.25 mg via INTRAVENOUS

## 2013-11-24 MED ORDER — DOCUSATE SODIUM 100 MG PO CAPS: 100.0000 mg | ORAL_CAPSULE | Freq: Two times a day (BID) | ORAL | Status: DC

## 2013-11-24 MED ORDER — HYDROMORPHONE HCL PF 1 MG/ML IJ SOLN
0.2500 mg | INTRAMUSCULAR | Status: DC | PRN
  Administered 2013-11-24 (×2): 0.5 mg via INTRAVENOUS

## 2013-11-24 MED ORDER — OXYCODONE-ACETAMINOPHEN 5-325 MG PO TABS: 1.0000 | ORAL_TABLET | ORAL | Status: DC | PRN

## 2013-11-24 MED ORDER — HYDROMORPHONE HCL PF 1 MG/ML IJ SOLN
INTRAMUSCULAR | Status: AC
  Filled 2013-11-24: qty 1

## 2013-11-24 MED ORDER — ONDANSETRON HCL 4 MG/2ML IJ SOLN
INTRAMUSCULAR | Status: DC | PRN
  Administered 2013-11-24: 4 mg via INTRAVENOUS

## 2013-11-24 MED ORDER — LABETALOL HCL 5 MG/ML IV SOLN
INTRAVENOUS | Status: AC
  Filled 2013-11-24: qty 4

## 2013-11-24 MED ORDER — ENOXAPARIN SODIUM 40 MG/0.4ML ~~LOC~~ SOLN
40.0000 mg | SUBCUTANEOUS | Status: DC
  Administered 2013-11-25: 40 mg via SUBCUTANEOUS
  Filled 2013-11-24 (×2): qty 0.4

## 2013-11-24 MED ORDER — BISACODYL 5 MG PO TBEC: 5.0000 mg | DELAYED_RELEASE_TABLET | Freq: Every day | ORAL | Status: DC | PRN

## 2013-11-24 MED ORDER — METOCLOPRAMIDE HCL 5 MG/ML IJ SOLN: 5.0000 mg | Freq: Three times a day (TID) | INTRAMUSCULAR | Status: DC | PRN

## 2013-11-24 MED ORDER — LIDOCAINE HCL (CARDIAC) 20 MG/ML IV SOLN
INTRAVENOUS | Status: AC
  Filled 2013-11-24: qty 5

## 2013-11-24 MED ORDER — CEFAZOLIN SODIUM-DEXTROSE 2-3 GM-% IV SOLR
2.0000 g | INTRAVENOUS | Status: AC
  Administered 2013-11-24: 2 g via INTRAVENOUS

## 2013-11-24 MED ORDER — SODIUM CHLORIDE 0.45 % IV SOLN
INTRAVENOUS | Status: DC
  Administered 2013-11-24: 18:00:00 via INTRAVENOUS

## 2013-11-24 SURGICAL SUPPLY — 55 items
BAG ZIPLOCK 12X15 (MISCELLANEOUS) ×3 IMPLANT
BIT DRILL 2.5X2.75 QC CALB (BIT) ×3 IMPLANT
BIT DRILL 2.9 CANN QC NONSTRL (BIT) ×3 IMPLANT
BIT DRILL 3.5X5.5 QC CALB (BIT) ×3 IMPLANT
BNDG GAUZE ELAST 4 BULKY (GAUZE/BANDAGES/DRESSINGS) IMPLANT
CHLORAPREP W/TINT 26ML (MISCELLANEOUS) IMPLANT
CLOTH 2% CHLOROHEXIDINE 3PK (PERSONAL CARE ITEMS) ×3 IMPLANT
CUFF TOURN SGL QUICK 34 (TOURNIQUET CUFF) ×2
CUFF TRNQT CYL 34X4X40X1 (TOURNIQUET CUFF) ×1 IMPLANT
DRAPE C-ARM 42X120 X-RAY (DRAPES) ×3 IMPLANT
DRAPE POUCH INSTRU U-SHP 10X18 (DRAPES) ×3 IMPLANT
DRAPE U-SHAPE 47X51 STRL (DRAPES) ×3 IMPLANT
DRSG ADAPTIC 3X8 NADH LF (GAUZE/BANDAGES/DRESSINGS) IMPLANT
DRSG PAD ABDOMINAL 8X10 ST (GAUZE/BANDAGES/DRESSINGS) IMPLANT
DURAPREP 26ML APPLICATOR (WOUND CARE) ×6 IMPLANT
ELECT REM PT RETURN 9FT ADLT (ELECTROSURGICAL) ×3
ELECTRODE REM PT RTRN 9FT ADLT (ELECTROSURGICAL) ×1 IMPLANT
GAUZE XEROFORM 5X9 LF (GAUZE/BANDAGES/DRESSINGS) ×3 IMPLANT
GLOVE BIOGEL PI IND STRL 7.5 (GLOVE) ×1 IMPLANT
GLOVE BIOGEL PI IND STRL 8 (GLOVE) ×1 IMPLANT
GLOVE BIOGEL PI INDICATOR 7.5 (GLOVE) ×2
GLOVE BIOGEL PI INDICATOR 8 (GLOVE) ×2
GLOVE SURG SS PI 7.5 STRL IVOR (GLOVE) ×3 IMPLANT
GLOVE SURG SS PI 8.0 STRL IVOR (GLOVE) ×6 IMPLANT
GOWN STRL REUS W/TWL LRG LVL3 (GOWN DISPOSABLE) ×3 IMPLANT
GOWN STRL REUS W/TWL XL LVL3 (GOWN DISPOSABLE) ×6 IMPLANT
K-WIRE ACE 1.6X6 (WIRE) ×6
KIT BASIN OR (CUSTOM PROCEDURE TRAY) ×3 IMPLANT
KWIRE ACE 1.6X6 (WIRE) ×2 IMPLANT
MANIFOLD NEPTUNE II (INSTRUMENTS) ×3 IMPLANT
NEEDLE HYPO 22GX1.5 SAFETY (NEEDLE) ×3 IMPLANT
PACK LOWER EXTREMITY WL (CUSTOM PROCEDURE TRAY) ×3 IMPLANT
PAD CAST 4YDX4 CTTN HI CHSV (CAST SUPPLIES) ×1 IMPLANT
PADDING CAST COTTON 4X4 STRL (CAST SUPPLIES) ×2
PLATE LOCK 7H 92 BILAT FIB (Plate) ×3 IMPLANT
POSITIONER SURGICAL ARM (MISCELLANEOUS) ×3 IMPLANT
SCOTCHCAST PLUS 4X4 WHITE (CAST SUPPLIES) ×6 IMPLANT
SCREW ACE CAN 4.0 44M (Screw) ×3 IMPLANT
SCREW CORTICAL 3.5MM 26MM (Screw) ×3 IMPLANT
SCREW CORTICAL LOW PROF 3.5X20 (Screw) ×3 IMPLANT
SCREW LOCK CORT STAR 3.5X14 (Screw) ×3 IMPLANT
SCREW LOCK CORT STAR 3.5X18 (Screw) ×3 IMPLANT
SCREW LOW PROFILE 22MMX3.5MM (Screw) ×3 IMPLANT
SCREW NLOCK CANC HEX 4X18 (Screw) ×2 IMPLANT
SCREW NLOCK CANC HEX 4X18 FIB (Screw) ×1 IMPLANT
SCREW NLOCK CANC HEX 4X22 (Screw) ×3 IMPLANT
SCREW NON LOCKING LP 3.5 16MM (Screw) ×9 IMPLANT
SPONGE GAUZE 4X4 12PLY (GAUZE/BANDAGES/DRESSINGS) ×3 IMPLANT
SUCTION FRAZIER 12FR DISP (SUCTIONS) ×3 IMPLANT
SUT ETHILON 4 0 PS 2 18 (SUTURE) ×6 IMPLANT
SUT VIC AB 1 CT1 27 (SUTURE) ×4
SUT VIC AB 1 CT1 27XBRD ANTBC (SUTURE) ×2 IMPLANT
SUT VIC AB 2-0 CT1 27 (SUTURE) ×2
SUT VIC AB 2-0 CT1 TAPERPNT 27 (SUTURE) ×1 IMPLANT
SYR CONTROL 10ML LL (SYRINGE) ×3 IMPLANT

## 2013-11-24 NOTE — Interval H&P Note (Signed)
History and Physical Interval Note:  11/24/2013 7:23 AM  Derek Browning  has presented today for surgery, with the diagnosis of RIGHT ANKLE BIMALLEOLAR  The various methods of treatment have been discussed with the patient and family. After consideration of risks, benefits and other options for treatment, the patient has consented to  Procedure(s): OPEN REDUCTION INTERNAL FIXATION (ORIF) RIGHT ANKLE (Right) as a surgical intervention .  The patient's history has been reviewed, patient examined, no change in status, stable for surgery.  I have reviewed the patient's chart and labs.  Questions were answered to the patient's satisfaction.     [name]

## 2013-11-24 NOTE — H&P (View-Only) (Signed)
Derek Browning DOB: 01-Mar-1951 Married / Language: English / Race: White Male  Chief complaint: R ankle pain  History of Present Illness The patient is a 63 year old male who presents with ankle complaints. The patient reports right ankle symptoms including: pain and swelling which began 1 day(s) ago following a specific injury. The injury occurred due to a fall (while coming down the stairs). Symptoms are reported to be located in the right ankle. Symptoms are exacerbated by weight bearing. Current treatment includes crutches (non-weight bearing) (and splinting). Prior to being seen today the patient was previously evaluated in the emergency room (W-L). Previous work-up for this problem has included ankle x-rays. Note for "Ankle pain": Derek Browning presents today with his wife. He injured his R ankle yesterday coming down his pool steps, came down wrong on it at the bottom of the steps, and rotated it. He had instant pain and presented to the ER where xrays revealed a bimalleolar ankle fx. He was placed in a padded splint and reports he is comfortable. He did not yet fill his pain medication, was given Rx for Percocet. He is on crutches. He denies numbness or tingling. Denies prior injury to this ankle or other injuries associated with this. He is an avid motorcycle rider and has a trip planned to [facility] in August.  Allergies No Known Drug Allergies. 11/18/2013  Family History First Degree Relatives. reported No pertinent family history  Social History Children. 2 Current drinker. 11/18/2013: Currently drinks beer only occasionally per week Current work status. working full time Exercise. Exercises daily; does other Living situation. live with spouse Marital status. married No history of drug/alcohol rehab Not under pain contract Number of flights of stairs before winded. 4-5 Tobacco / smoke exposure. 11/18/2013: no Tobacco use. Former smoker. 11/18/2013: smoke(d) 1  pack(s) per day  Past Surgical History Appendectomy Neck Disc Surgery  Past Medical Hx No pertinent past medical history  Review of Systems General:Not Present- Chills, Fever, Night Sweats, Appetite Loss, Fatigue, Feeling sick, Weight Gain and Weight Loss. Skin:Not Present- Itching, Rash, Skin Color Changes, Ulcer, Psoriasis and Change in Hair or Nails. HEENT:Not Present- Sensitivity to light, Hearing problems, Nose Bleed and Ringing in the Ears. Neck:Not Present- Swollen Glands and Neck Mass. Respiratory:Not Present- Snoring, Chronic Cough, Bloody sputum and Dyspnea. Cardiovascular:Not Present- Shortness of Breath, Chest Pain, Swelling of Extremities, Leg Cramps and Palpitations. Gastrointestinal:Not Present- Bloody Stool, Heartburn, Abdominal Pain, Vomiting, Nausea and Incontinence of Stool. Male Genitourinary:Not Present- Blood in Urine, Menstrual Irregularities, Frequency, Incontinence and Nocturia. Musculoskeletal:Present- Joint Stiffness, Joint Swelling and Joint Pain. Not Present- Muscle Weakness, Muscle Pain and Back Pain. Neurological:Not Present- Tingling, Numbness, Burning, Tremor, Headaches and Dizziness. Psychiatric:Not Present- Anxiety, Depression and Memory Loss. Endocrine:Not Present- Cold Intolerance, Heat Intolerance, Excessive hunger and Excessive Thirst. Hematology:Not Present- Abnormal Bleeding, Anemia, Blood Clots and Easy Bruising.  Vitals 11/18/2013 1:47 PM Weight: 160 lb Height: 66 in Body Surface Area: 1.84 m Body Mass Index: 25.82 kg/m BP: 180/88 (Sitting, Left Arm, Standard)  Physical Exam The physical exam findings are as follows:  General Mental Status - Alert. General Appearance- pleasant. Not in acute distress. Orientation- Oriented X3. Build & Nutrition- Well nourished and Well developed. Gait- Use of assistive device (crutches). Mental Status- Alert.  Peripheral Vascular Lower  Extremity: Palpation:Tenderness- Right- no calf tenderness to palpation. Posterior tibial pulse- Bilateral- 2+. Dorsalis pedis pulse- Bilateral- 2+.  Neurologic Sensation:Lower Extremity- Right- sensation is intact in the lower extremity, unless otherwise mentioned.  Musculoskeletal Lower Extremity  Right Lower Extremity: Right Ankle: Inspection and Palpation:Tenderness- lateral malleolus tender to palpation and medial malleolus tender to palpation. no tenderness to palpation of the Achilles tendon, no tenderness to palpation of the base of the 5th metatarsal and no tenderness to palpation of the calf. Swelling- mild. Effusion- none. Tissue tension/texture is - soft. Sensation is - normal. MBW:GYKZLDJ limited- Note: known ankle fx, ROM and strength not evaluated Right Foot: Inspection and Palpation:Tenderness- no tenderness to palpation. Swelling- mild.  Imaging xrays R ankle from cone system reviewed today by Dr. Tonita Cong with bimalleolar ankle fx, comminuted lateral malleolus fx and avulsed fragment from the medial malleolus. There is widening of the mortise noted.  Assessment & Plan Closed bimalleolar fracture of right ankle  Pt with R ankle bimalleolar fx s/p rotational injury x 1 day ago. We discussed the nature of his injury, due to mortise widening and instability of his fracture, recommend proceeding with surgery, ORIF R ankle. We discussed the procedure itself as well as risks, complications, and alternatives including but not limited to DVT, PE, infx, bleeding, failure of procedure, need for secondary procedure, anesthesia risk, even death. Discussed post-op protocols, time out of work, restricted weightbearing, need for DVT ppx, immobilization, restrictions. He is otherwise healthy, has no hx of MRSA or DVT, does not require clearance. We will proceed accordingly with scheduling surgery, all his questions were answered, and he desires to proceed. In the interim,  recommend continued aggressive ice and elevation, toes above the nose, to reduce swelling. Remain in splint, he was instructed to call us if it becomes too tight. He should remain NWB, using crutches, was also given Rx for kneeling scooter. He does have Rx for pain meds at home which he has not filled yet but may take as needed. Will plan tentatively to schedule for next week for ORIF. He will call with any questions in the interim but will follow up 10-14 days post-op for staple removal and xrays.  Plan R ankle ORIF  Signed electronically by Lacie Draft PA-C for Dr. Tonita Cong

## 2013-11-24 NOTE — Transfer of Care (Signed)
Immediate Anesthesia Transfer of Care Note  Patient: Derek Browning  Procedure(s) Performed: Procedure(s): OPEN REDUCTION INTERNAL FIXATION (ORIF) RIGHT ANKLE (Right)  Patient Location: PACU  Anesthesia Type:General  Level of Consciousness: sedated  Airway & Oxygen Therapy: Patient Spontanous Breathing and Patient connected to face mask oxygen  Post-op Assessment: Report given to PACU RN and Post -op Vital signs reviewed and stable  Post vital signs: Reviewed and stable  Complications: No apparent anesthesia complications

## 2013-11-24 NOTE — Brief Op Note (Signed)
11/24/2013  4:59 PM  PATIENT:  Colon Flattery  63 y.o. male  PRE-OPERATIVE DIAGNOSIS:  RIGHT ANKLE BIMALLEOLAR FRACTURE  POST-OPERATIVE DIAGNOSIS:  RIGHT ANKLE BIMALLEOLAR FRACTURE  PROCEDURE:  Procedure(s): OPEN REDUCTION INTERNAL FIXATION (ORIF) RIGHT ANKLE (Right)  SURGEON:  Surgeon(s) and Role:    * [name], MD - Primary  PHYSICIAN ASSISTANT:   ASSISTANTS: Bissell   ANESTHESIA:   general  EBL:  Total I/O In: 1700 [I.V.:1700] Out: -   BLOOD ADMINISTERED:none  DRAINS: none   LOCAL MEDICATIONS USED:  MARCAINE     SPECIMEN:  No Specimen  DISPOSITION OF SPECIMEN:  N/A  COUNTS:  YES  TOURNIQUET:   Total Tourniquet Time Documented: Thigh (Right) - 82 minutes Total: Thigh (Right) - 82 minutes   DICTATION: .Other Dictation: Dictation Number [id number]  PLAN OF CARE: Admit for overnight observation  PATIENT DISPOSITION:  PACU - hemodynamically stable.   Delay start of Pharmacological VTE agent (>24hrs) due to surgical blood loss or risk of bleeding: no

## 2013-11-24 NOTE — Anesthesia Postprocedure Evaluation (Signed)
  Anesthesia Post-op Note  Patient: Derek Browning  Procedure(s) Performed: Procedure(s) (LRB): OPEN REDUCTION INTERNAL FIXATION (ORIF) RIGHT ANKLE (Right)  Patient Location: PACU  Anesthesia Type: General  Level of Consciousness: awake and alert   Airway and Oxygen Therapy: Patient Spontanous Breathing  Post-op Pain: mild  Post-op Assessment: Post-op Vital signs reviewed, Patient's Cardiovascular Status Stable, Respiratory Function Stable, Patent Airway and No signs of Nausea or vomiting  Last Vitals:  Filed Vitals:   11/24/13 1815  BP: 174/89  Pulse: 78  Temp:   Resp: 14    Post-op Vital Signs: stable   Complications: No apparent anesthesia complications

## 2013-11-24 NOTE — Discharge Instructions (Signed)
Remain non weightbearing right leg, use crutches or kneeling scooter Ice and elevate right leg Take aspirin 325mg  daily to prevent blood clots Follow up 10-14 days for staple removal and xrays

## 2013-11-24 NOTE — Anesthesia Preprocedure Evaluation (Addendum)
Anesthesia Evaluation  Patient identified by MRN, date of birth, ID band Patient awake    Reviewed: Allergy & Precautions, H&P , NPO status , Patient's Chart, lab work & pertinent test results  Airway Mallampati: II TM Distance: >3 FB Neck ROM: full    Dental  (+) Edentulous Upper, Edentulous Lower, Dental Advisory Given   Pulmonary neg pulmonary ROS, former smoker,  breath sounds clear to auscultation  Pulmonary exam normal       Cardiovascular Exercise Tolerance: Good negative cardio ROS  Rhythm:regular Rate:Normal     Neuro/Psych negative neurological ROS  negative psych ROS   GI/Hepatic negative GI ROS, Neg liver ROS,   Endo/Other  negative endocrine ROS  Renal/GU negative Renal ROS  negative genitourinary   Musculoskeletal   Abdominal   Peds  Hematology negative hematology ROS (+)   Anesthesia Other Findings   Reproductive/Obstetrics negative OB ROS                           Anesthesia Physical  Anesthesia Plan  ASA: I  Anesthesia Plan: General   Post-op Pain Management:    Induction: Intravenous  Airway Management Planned: LMA  Additional Equipment:   Intra-op Plan:   Post-operative Plan:   Informed Consent: I have reviewed the patients History and Physical, chart, labs and discussed the procedure including the risks, benefits and alternatives for the proposed anesthesia with the patient or authorized representative who has indicated his/her understanding and acceptance.   Dental Advisory Given  Plan Discussed with: CRNA and Surgeon  Anesthesia Plan Comments:         Anesthesia Quick Evaluation  

## 2013-11-25 ENCOUNTER — Encounter (HOSPITAL_COMMUNITY): Payer: Self-pay | Admitting: Specialist

## 2013-11-25 LAB — BASIC METABOLIC PANEL
BUN: 14 mg/dL (ref 6–23)
CHLORIDE: 101 meq/L (ref 96–112)
CO2: 22 meq/L (ref 19–32)
Calcium: 9.2 mg/dL (ref 8.4–10.5)
Creatinine, Ser: 1.09 mg/dL (ref 0.50–1.35)
GFR calc Af Amer: 81 mL/min — ABNORMAL LOW (ref >90)
GFR calc non Af Amer: 70 mL/min — ABNORMAL LOW (ref >90)
GLUCOSE: 158 mg/dL — AB (ref 70–99)
POTASSIUM: 4.8 meq/L (ref 3.7–5.3)
Sodium: 136 mEq/L — ABNORMAL LOW (ref 137–147)

## 2013-11-25 NOTE — Evaluation (Signed)
Physical Therapy Evaluation Patient Details Name: Derek Browning MRN: [id number] DOB: 04/25/1951 Today's Date: 11/25/2013   History of Present Illness     Clinical Impression  Pt s/p R ankle fx and presents with functional mobility limitations 2* NWB status and post op discomfort.  Pt mobilizing at Sup to Mod I level.  Pt and spouse educated on proper fit of axillary crutches.  Pt plans d/c home this date with family assist    Follow Up Recommendations      Equipment Recommendations  None recommended by PT    Recommendations for Other Services       Precautions / Restrictions Precautions Precautions: Fall Restrictions Weight Bearing Restrictions: Yes RLE Weight Bearing: Non weight bearing      Mobility  Bed Mobility Overal bed mobility: Modified Independent                Transfers Overall transfer level: Needs assistance Equipment used: Rolling walker (2 wheeled);Crutches Transfers: Sit to/from Stand Sit to Stand: Supervision         General transfer comment: cues for use of UEs and for management of R LE to avoid WB  Ambulation/Gait Ambulation/Gait assistance: Min guard;Supervision Ambulation Distance (Feet): 400 Feet Assistive device: Crutches Gait Pattern/deviations: Step-to pattern;Step-through pattern;Trunk flexed     General Gait Details: cues for posture and pacing; several standing rest breaks required to complete task  Stairs            Wheelchair Mobility    Modified Rankin (Stroke Patients Only)       Balance                                             Pertinent Vitals/Pain 2/10    Home Living Family/patient expects to be discharged to:: Private residence Living Arrangements: Spouse/significant other Available Help at Discharge: Family Type of Home: House Home Access: Level entry     Home Layout: One level Home Equipment: Crutches;Other (comment) (knee scooter)      Prior Function Level of  Independence: Independent               Hand Dominance        Extremity/Trunk Assessment   Upper Extremity Assessment: Overall WFL for tasks assessed           Lower Extremity Assessment: RLE deficits/detail RLE Deficits / Details: Ankle immobilized; hip/knee WFL    Cervical / Trunk Assessment: Normal  Communication   Communication: No difficulties  Cognition Arousal/Alertness: Awake/alert Behavior During Therapy: WFL for tasks assessed/performed Overall Cognitive Status: Within Functional Limits for tasks assessed                      General Comments      Exercises        Assessment/Plan    PT Assessment Patent does not need any further PT services  PT Diagnosis     PT Problem List    PT Treatment Interventions DME instruction;Gait training;Functional mobility training;Patient/family education   PT Goals (Current goals can be found in the Care Plan section) Acute Rehab PT Goals PT Goal Formulation: No goals set, d/c therapy    Frequency     Barriers to discharge        Co-evaluation               End of Session Equipment Utilized  During Treatment: Gait belt Activity Tolerance: Patient tolerated treatment well Patient left: in chair;with call bell/phone within reach;with family/visitor present Nurse Communication: Mobility status    Functional Assessment Tool Used: clinical judgement Functional Limitation: Mobility: Walking and moving around Mobility: Walking and Moving Around Current Status (W2376): At least 1 percent but less than 20 percent impaired, limited or restricted Mobility: Walking and Moving Around Goal Status [phone]): At least 1 percent but less than 20 percent impaired, limited or restricted Mobility: Walking and Moving Around Discharge Status [phone]): At least 1 percent but less than 20 percent impaired, limited or restricted    Time: 0930-1002 PT Time Calculation (min): 32 min   Charges:   PT  Evaluation $Initial PT Evaluation Tier I: 1 Procedure PT Treatments $Gait Training: 8-22 mins   PT G Codes:   Functional Assessment Tool Used: clinical judgement Functional Limitation: Mobility: Walking and moving around    [employer] 11/25/2013, 11:36 AM

## 2013-11-25 NOTE — Op Note (Signed)
NAMENIHAR, KLUS                ACCOUNT NO.:  [id number]  MEDICAL RECORD NO.:  [id number]  LOCATION:  4782                         FACILITY:  [facility]  PHYSICIAN:  Susa Day, M.D.    DATE OF BIRTH:  1951/01/18  DATE OF PROCEDURE:  11/24/2013 DATE OF DISCHARGE:                              OPERATIVE REPORT   PREOPERATIVE DIAGNOSIS:  Bimalleolar ankle fracture, right.  POSTOPERATIVE DIAGNOSIS:  Bimalleolar ankle fracture, right.  PROCEDURE PERFORMED: 1. Open reduction and internal fixation, right ankle fracture. 2. Stress radiographs under anesthesia.  ANESTHESIA:  General.  SURGEON:  Susa Day, M.D.  ASSISTANT:  Cleophas Dunker, PA.  HISTORY:  A 63 year old gentleman with fracture dislocation, ankle reduced, he had a fracture of the medial malleolus and of the fibula at the level of the mortise.  It is indicated for open reduction and internal fixation.  Risk and benefits were discussed including bleeding, infection, damage to neurovascular, DVT, PE, anesthetic complications, etc.  TECHNIQUE:  With the patient in supine position, after induction of adequate general anesthesia, 2 g Kefzol, the right lower extremity was prepped and draped in usual sterile fashion.  Thigh tourniquet inflated to 250 mmHg after exsanguination.  Prior to that, he had 2 large fracture broachers, one medial proximal and two lateral distal, both out of the operative site.  He had a large amount of soft tissue swelling and ecchymoses.  Most compartments are soft.  Attention was turned towards the fibula, first incision was made over the distal fibula about 10 cm in length.  Subcutaneous tissues were dissected.  Electrocautery was utilized to achieve hemostasis.  We preserved the superficial perineal nerve and its branches, identified in the lateral aspect of the fibula and the fracture site.  Fracture site was curetted and irrigated, and the fracture was reduced with a tenaculum, placed  an interfragmentary screw.  After over drilling with a 3.5 drill proximally and a 2.5 drill proximal to distal, insertion of the appropriate length cortical screw with excellent purchase.  Then expression of the fracture hematoma was stable construct, then contoured a Biomet plate over the fibula.  Fixed it proximally with 3 fully-threaded cortical screws and distally, we placed 2 locking screws and the third from the bottom, a cortical screw at the appropriate drilling, depth gauge measurement, insertion of the screw.  The plate was contoured maximally to fix to the fibula, it was stable.  Wound copiously irrigated.  The AP and lateral plane.  The mortise was reconstructed, the fibula was out to length. Attention was turned towards the medial malleolus.  Incision was made over the medial malleolus, it was dissected down to the periosteum, was infolded into the fracture site.  It was removed from the fracture site, debrided, irrigated, curetted, and reduced.  It was stable for a single screw.  We then placed a guide pin up into the medial malleolus and over drilled with a cannulated screw 44 partially threaded was inserted with excellent purchase.  Reduction of the fracture and excellent restoration of the mortise and fixation of the medial malleolus.  Stress radiographs revealed a stable syndesmosis and an excellent reconstruction of the mortise.  Both wounds were copiously  irrigated.  Closed the periosteum with 2-0 Vicryl medially, and the subcu with 2-0, and skin with staples. Care taken not to incarcerate the neurovascular structures.  Tourniquet was deflated with adequate revascularization of lower extremity appreciated.  A well-molded cast was placed, fiberglass with the ankle in the neutral position.  He was then transported to the recovery room in satisfactory condition.  He tolerated the procedure well without complications.  Tourniquet time 82 minutes.     Susa Day,  M.D.     Geralynn Rile  D:  11/24/2013  T:  11/25/2013  Job:  [id number]

## 2013-11-25 NOTE — Progress Notes (Signed)
Subjective: 1 Day Post-Op Procedure(s) (LRB): OPEN REDUCTION INTERNAL FIXATION (ORIF) RIGHT ANKLE (Right) Patient reports pain as mild.  Seen in AM rounds by Dr. Tonita Cong. Pain well controlled and pt would like to go home today. No other c/o.  Objective: Vital signs in last 24 hours: Temp:  [97.5 F (36.4 C)-98.8 F (37.1 C)] 98.1 F (36.7 C) (05/28 0606) Pulse Rate:  [73-96] 76 (05/28 0606) Resp:  [9-24] 16 (05/28 0148) BP: (123-206)/(72-101) 128/74 mmHg (05/28 0606) SpO2:  [96 %-100 %] 98 % (05/28 0606) Weight:  [70.761 kg (156 lb)] 70.761 kg (156 lb) (05/27 2311)  Intake/Output from previous day: 05/27 0701 - 05/28 0700 In: 2634.2 [P.O.:240; I.V.:2394.2] Out: 1300 [Urine:1300] Intake/Output this shift:     Recent Labs  11/23/13 1350  HGB 14.7    Recent Labs  11/23/13 1350  WBC 7.2  RBC 4.95  HCT 42.9  PLT 267    Recent Labs  11/23/13 1350 11/25/13 0430  NA 136* 136*  K 5.0 4.8  CL 96 101  CO2 28 22  BUN 14 14  CREATININE 1.05 1.09  GLUCOSE 100* 158*  CALCIUM 10.0 9.2   No results found for this basename: LABPT, INR,  in the last 72 hours  Neurologically intact ABD soft Neurovascular intact Sensation intact distally Intact pulses distally Dorsiflexion/Plantar flexion intact No cellulitis present Compartment soft cast clean and dry, no drainage, no sign of DVT  Assessment/Plan: 1 Day Post-Op Procedure(s) (LRB): OPEN REDUCTION INTERNAL FIXATION (ORIF) RIGHT ANKLE (Right) Advance diet Up with therapy D/C IV fluids Seen by Dr. Tonita Cong, discussed D/C instructions Remain NWB RLE ASA for DVT ppx on D/C Ice and elevation Follow up 10-14 days for staple removal and xrays  Jaclyn M. Bissell 11/25/2013, 7:57 AM

## 2013-11-25 NOTE — Progress Notes (Signed)
Pt to d/c home. No DME needs. AVS reviewed and "My Chart" discussed with pt. Pt capable of verbalizing medications and follow-up appointments. Remains hemodynamically stable. No signs and symptoms of distress. Educated pt to return to ER in the case of SOB, dizziness, or chest pain.

## 2013-11-25 NOTE — Evaluation (Signed)
Occupational Therapy Evaluation Patient Details Name: Derek Browning MRN: [id number] DOB: December 11, 1950 Today's Date: 11/25/2013    History of Present Illness Pt is s/p ORIF R ankle.    Clinical Impression   Pt to d/c this am. All education completed with pt and wife for safety with ADL. No further OT needs. Pt very familiar with tasks from managing PTA before his surgery with his injury.     Follow Up Recommendations  No OT follow up;Supervision/Assistance - 24 hour    Equipment Recommendations  None recommended by OT    Recommendations for Other Services       Precautions / Restrictions Precautions Precautions: Fall Restrictions Weight Bearing Restrictions: Yes RLE Weight Bearing: Non weight bearing      Mobility Bed Mobility Overal bed mobility: Modified Independent                Transfers Overall transfer level: Needs assistance Equipment used: Rolling walker (2 wheeled) Transfers: Sit to/from Stand Sit to Stand: Supervision         General transfer comment: min verbal cues for safety    Balance                                            ADL Overall ADL's : Needs assistance/impaired Eating/Feeding: Independent;Sitting   Grooming: Wash/dry hands;Set up;Sitting   Upper Body Bathing: Set up;Sitting   Lower Body Bathing: Supervison/ safety;Bed level;Sitting/lateral leans   Upper Body Dressing : Set up;Sitting   Lower Body Dressing: Supervision/safety;Sitting/lateral leans;Bed level Lower Body Dressing Details (indicate cue type and reason): Pt sat EOB to don underwear and shorts over LEs and then transferred to supine to pull clothing up over hips.  Toilet Transfer: Supervision/safety;Ambulation;Comfort height toilet;RW;Grab bars   Toileting- Clothing Manipulation and Hygiene: Min guard;Sit to/from stand         General ADL Comments: Pt and wife present for session. Pt states he has been using his knee scooter for the week  between his fall and surgery. he plans to use scooter inside his house and has been taking scooter into bathroom also and using handle on scooter and vanity to transfer on and off toilet. He declines need for 3in1 and did well with comfort height  toilets here with UE support. His wife states his shwoer is completely walk in so he can roll his scooter right up to his shower seat. He declines having any concern about continuing to tranfer into shower once home. Pt did well with dressing from sitting EOB and then lying back supine to pull clothing up. Cautioned pt on going alittle slower with activity for safety..      Vision                     Perception     Praxis      Pertinent Vitals/Pain No complaint of     Hand Dominance     Extremity/Trunk Assessment Upper Extremity Assessment Upper Extremity Assessment: Overall WFL for tasks assessed      Cervical / Trunk Assessment Cervical / Trunk Assessment: Normal   Communication Communication Communication: No difficulties   Cognition Arousal/Alertness: Awake/alert Behavior During Therapy: WFL for tasks assessed/performed Overall Cognitive Status: Within Functional Limits for tasks assessed                     General Comments  Exercises       Shoulder Instructions      Home Living Family/patient expects to be discharged to:: Private residence Living Arrangements: Spouse/significant other Available Help at Discharge: Family Type of Home: House Home Access: Level entry     Home Layout: One level     Bathroom Shower/Tub: Walk-in shower (completly walk in shower with seat)   Bathroom Toilet: Handicapped height     Home Equipment: Crutches;Other (comment);Shower seat - built in;Hand held shower head (knee scooter)          Prior Functioning/Environment Level of Independence: Independent             OT Diagnosis:     OT Problem List:     OT Treatment/Interventions:      OT  Goals(Current goals can be found in the care plan section) Acute Rehab OT Goals Patient Stated Goal: home  OT Frequency:     Barriers to D/C:            Co-evaluation              End of Session Equipment Utilized During Treatment: Rolling walker  Activity Tolerance: Patient tolerated treatment well Patient left: in bed;with call bell/phone within reach;with family/visitor present   Time: 4765-4650 OT Time Calculation (min): 12 min Charges:  OT General Charges $OT Visit: 1 Procedure OT Evaluation $Initial OT Evaluation Tier I: 1 Procedure OT Treatments $Therapeutic Activity: 8-22 mins G-Codes: OT G-codes **NOT FOR INPATIENT CLASS** Functional Assessment Tool Used: clinical judgement Functional Limitation: Self care Self Care Current Status (P5465): At least 1 percent but less than 20 percent impaired, limited or restricted Self Care Goal Status (K8127): At least 1 percent but less than 20 percent impaired, limited or restricted Self Care Discharge Status [phone]): At least 1 percent but less than 20 percent impaired, limited or restricted  Jules Schick [phone] 11/25/2013, 11:52 AM

## 2013-11-25 NOTE — Discharge Summary (Signed)
Patient ID: Derek Browning MRN: [id number] DOB/AGE: Mar 24, 1951 63 y.o.  Admit date: 11/24/2013 Discharge date: 11/25/2013  Admission Diagnoses:  Principal Problem:   Bimalleolar fracture of right ankle   Discharge Diagnoses:  Same  Past Medical History  Diagnosis Date  . Arthritis     Surgeries: Procedure(s): OPEN REDUCTION INTERNAL FIXATION (ORIF) RIGHT ANKLE on 11/24/2013   Consultants:    Discharged Condition: Improved  Hospital Course: Domique Clapper is an 63 y.o. male who was admitted 11/24/2013 for operative treatment ofBimalleolar fracture of right ankle. Patient has severe unremitting pain that affects sleep, daily activities, and work/hobbies. After pre-op clearance the patient was taken to the operating room on 11/24/2013 and underwent  Procedure(s): OPEN REDUCTION INTERNAL FIXATION (ORIF) RIGHT ANKLE.    Patient was given perioperative antibiotics: Anti-infectives   Start     Dose/Rate Route Frequency Ordered Stop   11/25/13 0600  ceFAZolin (ANCEF) IVPB 2 g/50 mL premix     2 g 100 mL/hr over 30 Minutes Intravenous On call to O.R. 11/24/13 1225 11/24/13 1500   11/24/13 2200  ceFAZolin (ANCEF) IVPB 2 g/50 mL premix     2 g 100 mL/hr over 30 Minutes Intravenous Every 6 hours 11/24/13 1833 11/25/13 1001   11/24/13 1538  polymyxin B 500,000 Units, bacitracin 50,000 Units in sodium chloride irrigation 0.9 % 500 mL irrigation  Status:  Discontinued       As needed 11/24/13 1538 11/24/13 1655       Patient was given sequential compression devices, early ambulation, and chemoprophylaxis to prevent DVT.  Patient benefited maximally from hospital stay and there were no complications.    Recent vital signs: Patient Vitals for the past 24 hrs:  BP Temp Temp src Pulse Resp SpO2 Height Weight  11/25/13 0944 121/69 mmHg 97.5 F (36.4 C) Oral 75 18 95 % - -  11/25/13 0606 128/74 mmHg 98.1 F (36.7 C) Oral 76 - 98 % - -  11/25/13 0148 123/72 mmHg 98.3 F (36.8 C) Oral 81 16 97  % - -  11/24/13 2311 - - - - - - 5\' 6"  (1.676 m) 70.761 kg (156 lb)  11/24/13 2130 163/75 mmHg 97.8 F (36.6 C) Oral 85 16 100 % - -  11/24/13 1930 177/79 mmHg 97.9 F (36.6 C) Oral 85 16 100 % - -  11/24/13 1824 186/96 mmHg 98 F (36.7 C) - 77 14 - - -  11/24/13 1815 174/89 mmHg - - 78 14 100 % - -  11/24/13 1810 173/90 mmHg 98.8 F (37.1 C) - 81 14 99 % - -  11/24/13 1805 176/84 mmHg - - 78 11 97 % - -  11/24/13 1800 191/91 mmHg - - 77 12 99 % - -  11/24/13 1755 184/88 mmHg - - 78 10 98 % - -  11/24/13 1750 206/91 mmHg - - 83 9 97 % - -  11/24/13 1745 197/86 mmHg - - 86 9 99 % - -  11/24/13 1730 194/93 mmHg - - 86 13 99 % - -  11/24/13 1715 198/98 mmHg 98.6 F (37 C) - 89 10 97 % - -  11/24/13 1710 - - - 93 14 96 % - -  11/24/13 1705 - - - 96 20 100 % - -  11/24/13 1700 182/101 mmHg - - 85 24 98 % - -  11/24/13 1101 158/75 mmHg 97.5 F (36.4 C) Oral 73 16 99 % - -     Recent laboratory studies:  Recent Labs  11/23/13 1350 11/25/13 0430  WBC 7.2  --   HGB 14.7  --   HCT 42.9  --   PLT 267  --   NA 136* 136*  K 5.0 4.8  CL 96 101  CO2 28 22  BUN 14 14  CREATININE 1.05 1.09  GLUCOSE 100* 158*  CALCIUM 10.0 9.2     Discharge Medications:     Medication List         docusate sodium 100 MG capsule  Commonly known as:  COLACE  Take 1 capsule (100 mg total) by mouth 2 (two) times daily.     methocarbamol 500 MG tablet  Commonly known as:  ROBAXIN  Take 1 tablet (500 mg total) by mouth every 6 (six) hours as needed for muscle spasms.     naproxen sodium 220 MG tablet  Commonly known as:  ANAPROX  Take 220 mg by mouth 2 (two) times daily as needed (pain).     oxyCODONE-acetaminophen 5-325 MG per tablet  Commonly known as:  ROXICET  Take 1-2 tablets by mouth every 4 (four) hours as needed for severe pain.        Diagnostic Studies: Dg Ankle 2 Views Right  12-08-2013   CLINICAL DATA:  ORIF of the right ankle  EXAM: RIGHT ANKLE - 2 VIEW  COMPARISON:  None.   FINDINGS: Fluoro time is 33 seconds.  Serial fluoroscopic images demonstrate orthopedic side plate fixated by screws in the distal fibula with no malalignment. There is insertion of a screw for fixation of fracture of the medial malleolus without gross malalignment.  IMPRESSION: Serial fluoroscopic images demonstrate orthopedic side plate fixated by screws in the distal fibula with no malalignment. There is insertion of a screw for fixation of fracture of the medial malleolus without gross malalignment.   Electronically Signed   By: Abelardo Diesel M.D.   On: 08-Dec-2013 16:51   Dg Ankle Complete Right  11/17/2013   CLINICAL DATA:  ANKLE PAIN  EXAM: RIGHT ANKLE - COMPLETE 3+ VIEW  COMPARISON:  None.  FINDINGS: Comminuted fracture involving distal fibula and lateral malleolus. An avulsion fracture of the medial malleolus with lateral displacement. These fractures demonstrate intra-articular extension. The areas of widening and narrowing of the medial aspect of the talotibial joint.  IMPRESSION: Bimalleolar ankle fracture  with disruption of the ankle mortise.   Electronically Signed   By: Margaree Mackintosh M.D.   On: 11/17/2013 21:21   Dg C-arm 61-120 Min-no Report  08-Dec-2013   CLINICAL DATA: right ankle fracture   C-ARM 61-120 MINUTES  Fluoroscopy was utilized by the requesting physician.  No radiographic  interpretation.     Disposition: 01-Home or Self Care Ice and elevate R leg  Nonweightbearing R leg Keep cast clean and dry     Discharge Instructions   Call MD / Call 911    Complete by:  As directed   If you experience chest pain or shortness of breath, CALL 911 and be transported to the hospital emergency room.  If you develope a fever above 101 F, pus (white drainage) or increased drainage or redness at the wound, or calf pain, call your surgeon's office.     Constipation Prevention    Complete by:  As directed   Drink plenty of fluids.  Prune juice may be helpful.  You may use a stool softener,  such as Colace (over the counter) 100 mg twice a day.  Use MiraLax (over the counter)  for constipation as needed.     Diet - low sodium heart healthy    Complete by:  As directed      Increase activity slowly as tolerated    Complete by:  As directed            Follow-up Information   Follow up with BEANE,JEFFREY C, MD In 2 weeks. (For suture removal)    Specialty:  Orthopedic Surgery   Contact information:   [address] [place] [address] [phone]        Signed: Conley Rolls. [place] Yeats 11/25/2013, 10:24 AM

## 2013-12-08 ENCOUNTER — Encounter (HOSPITAL_COMMUNITY): Payer: Self-pay | Admitting: Emergency Medicine

## 2013-12-08 ENCOUNTER — Inpatient Hospital Stay (HOSPITAL_COMMUNITY)
Admission: EM | Admit: 2013-12-08 | Discharge: 2013-12-11 | DRG: 872 | Disposition: A | Discharge status: Home / Self Care | Payer: BC Managed Care – PPO | Attending: Family Medicine | Admitting: Family Medicine

## 2013-12-08 ENCOUNTER — Emergency Department (HOSPITAL_COMMUNITY): Payer: BC Managed Care – PPO

## 2013-12-08 DIAGNOSIS — Z888 Allergy status to other drugs, medicaments and biological substances status: Secondary | ICD-10-CM

## 2013-12-08 DIAGNOSIS — Z79899 Other long term (current) drug therapy: Secondary | ICD-10-CM

## 2013-12-08 DIAGNOSIS — Z87891 Personal history of nicotine dependence: Secondary | ICD-10-CM

## 2013-12-08 DIAGNOSIS — L270 Generalized skin eruption due to drugs and medicaments taken internally: Secondary | ICD-10-CM | POA: Diagnosis not present

## 2013-12-08 DIAGNOSIS — N390 Urinary tract infection, site not specified: Secondary | ICD-10-CM | POA: Diagnosis present

## 2013-12-08 DIAGNOSIS — T361X5A Adverse effect of cephalosporins and other beta-lactam antibiotics, initial encounter: Secondary | ICD-10-CM | POA: Diagnosis not present

## 2013-12-08 DIAGNOSIS — Z8619 Personal history of other infectious and parasitic diseases: Secondary | ICD-10-CM

## 2013-12-08 DIAGNOSIS — Y9210 Unspecified residential institution as the place of occurrence of the external cause: Secondary | ICD-10-CM | POA: Diagnosis not present

## 2013-12-08 DIAGNOSIS — A419 Sepsis, unspecified organism: Principal | ICD-10-CM | POA: Diagnosis present

## 2013-12-08 DIAGNOSIS — E860 Dehydration: Secondary | ICD-10-CM

## 2013-12-08 DIAGNOSIS — E871 Hypo-osmolality and hyponatremia: Secondary | ICD-10-CM

## 2013-12-08 DIAGNOSIS — Z9089 Acquired absence of other organs: Secondary | ICD-10-CM

## 2013-12-08 DIAGNOSIS — S82841A Displaced bimalleolar fracture of right lower leg, initial encounter for closed fracture: Secondary | ICD-10-CM

## 2013-12-08 HISTORY — DX: Personal history of other infectious and parasitic diseases: Z86.19

## 2013-12-08 LAB — URINALYSIS, ROUTINE W REFLEX MICROSCOPIC
Bilirubin Urine: NEGATIVE
Glucose, UA: NEGATIVE mg/dL
Ketones, ur: NEGATIVE mg/dL
NITRITE: NEGATIVE
PROTEIN: 30 mg/dL — AB
Specific Gravity, Urine: 1.011 (ref 1.005–1.030)
UROBILINOGEN UA: 1 mg/dL (ref 0.0–1.0)
pH: 6.5 (ref 5.0–8.0)

## 2013-12-08 LAB — COMPREHENSIVE METABOLIC PANEL
ALT: 15 U/L (ref 0–53)
AST: 15 U/L (ref 0–37)
Albumin: 3.4 g/dL — ABNORMAL LOW (ref 3.5–5.2)
Alkaline Phosphatase: 51 U/L (ref 39–117)
BUN: 13 mg/dL (ref 6–23)
CO2: 22 meq/L (ref 19–32)
CREATININE: 1.15 mg/dL (ref 0.50–1.35)
Calcium: 9.3 mg/dL (ref 8.4–10.5)
Chloride: 94 mEq/L — ABNORMAL LOW (ref 96–112)
GFR calc Af Amer: 76 mL/min — ABNORMAL LOW (ref >90)
GFR, EST NON AFRICAN AMERICAN: 65 mL/min — AB (ref >90)
Glucose, Bld: 154 mg/dL — ABNORMAL HIGH (ref 70–99)
Potassium: 4.3 mEq/L (ref 3.7–5.3)
Sodium: 131 mEq/L — ABNORMAL LOW (ref 137–147)
Total Bilirubin: 0.9 mg/dL (ref 0.3–1.2)
Total Protein: 7.8 g/dL (ref 6.0–8.3)

## 2013-12-08 LAB — CBC WITH DIFFERENTIAL/PLATELET
Basophils Absolute: 0 10*3/uL (ref 0.0–0.1)
Basophils Relative: 0 % (ref 0–1)
EOS PCT: 0 % (ref 0–5)
Eosinophils Absolute: 0 10*3/uL (ref 0.0–0.7)
HEMATOCRIT: 39.5 % (ref 39.0–52.0)
Hemoglobin: 13.6 g/dL (ref 13.0–17.0)
LYMPHS ABS: 0.6 10*3/uL — AB (ref 0.7–4.0)
LYMPHS PCT: 4 % — AB (ref 12–46)
MCH: 30.1 pg (ref 26.0–34.0)
MCHC: 34.4 g/dL (ref 30.0–36.0)
MCV: 87.4 fL (ref 78.0–100.0)
MONO ABS: 1.3 10*3/uL — AB (ref 0.1–1.0)
MONOS PCT: 10 % (ref 3–12)
Neutro Abs: 11.4 10*3/uL — ABNORMAL HIGH (ref 1.7–7.7)
Neutrophils Relative %: 86 % — ABNORMAL HIGH (ref 43–77)
Platelets: 211 10*3/uL (ref 150–400)
RBC: 4.52 MIL/uL (ref 4.22–5.81)
RDW: 12.9 % (ref 11.5–15.5)
WBC: 13.3 10*3/uL — AB (ref 4.0–10.5)

## 2013-12-08 LAB — URINE MICROSCOPIC-ADD ON

## 2013-12-08 LAB — I-STAT CG4 LACTIC ACID, ED: Lactic Acid, Venous: 1.73 mmol/L (ref 0.5–2.2)

## 2013-12-08 MED ORDER — CIPROFLOXACIN IN D5W 400 MG/200ML IV SOLN
400.0000 mg | Freq: Two times a day (BID) | INTRAVENOUS | Status: DC
  Administered 2013-12-08 – 2013-12-11 (×6): 400 mg via INTRAVENOUS
  Filled 2013-12-08 (×6): qty 200

## 2013-12-08 MED ORDER — ACETAMINOPHEN 500 MG PO TABS
1000.0000 mg | ORAL_TABLET | Freq: Once | ORAL | Status: AC
  Administered 2013-12-08: 1000 mg via ORAL
  Filled 2013-12-08: qty 2

## 2013-12-08 MED ORDER — ACETAMINOPHEN 650 MG RE SUPP: 650.0000 mg | Freq: Four times a day (QID) | RECTAL | Status: DC | PRN

## 2013-12-08 MED ORDER — SODIUM CHLORIDE 0.9 % IV SOLN
1000.0000 mL | Freq: Once | INTRAVENOUS | Status: AC
Start: 2013-12-08 — End: 2013-12-08
  Administered 2013-12-08: 1000 mL via INTRAVENOUS

## 2013-12-08 MED ORDER — DEXTROSE 5 % IV SOLN
2.0000 g | Freq: Two times a day (BID) | INTRAVENOUS | Status: DC
  Filled 2013-12-08: qty 2

## 2013-12-08 MED ORDER — SODIUM CHLORIDE 0.9 % IV SOLN
INTRAVENOUS | Status: DC
  Administered 2013-12-08 – 2013-12-11 (×2): via INTRAVENOUS

## 2013-12-08 MED ORDER — ENOXAPARIN SODIUM 40 MG/0.4ML ~~LOC~~ SOLN
40.0000 mg | SUBCUTANEOUS | Status: DC
  Administered 2013-12-08 – 2013-12-10 (×3): 40 mg via SUBCUTANEOUS
  Filled 2013-12-08 (×4): qty 0.4

## 2013-12-08 MED ORDER — NAPROXEN 500 MG PO TABS: 500.0000 mg | ORAL_TABLET | Freq: Once | ORAL | Status: DC

## 2013-12-08 MED ORDER — ONDANSETRON HCL 4 MG PO TABS: 4.0000 mg | ORAL_TABLET | Freq: Four times a day (QID) | ORAL | Status: DC | PRN

## 2013-12-08 MED ORDER — DIPHENHYDRAMINE HCL 50 MG/ML IJ SOLN
25.0000 mg | Freq: Once | INTRAMUSCULAR | Status: AC
  Administered 2013-12-08: 25 mg via INTRAVENOUS
  Filled 2013-12-08: qty 1

## 2013-12-08 MED ORDER — ONDANSETRON HCL 4 MG/2ML IJ SOLN: 4.0000 mg | Freq: Four times a day (QID) | INTRAMUSCULAR | Status: DC | PRN

## 2013-12-08 MED ORDER — METHOCARBAMOL 500 MG PO TABS: 500.0000 mg | ORAL_TABLET | Freq: Four times a day (QID) | ORAL | Status: DC | PRN

## 2013-12-08 MED ORDER — DOCUSATE SODIUM 100 MG PO CAPS
100.0000 mg | ORAL_CAPSULE | Freq: Two times a day (BID) | ORAL | Status: DC
  Administered 2013-12-08 – 2013-12-11 (×5): 100 mg via ORAL
  Filled 2013-12-08 (×7): qty 1

## 2013-12-08 MED ORDER — OXYCODONE-ACETAMINOPHEN 5-325 MG PO TABS: 1.0000 | ORAL_TABLET | ORAL | Status: DC | PRN

## 2013-12-08 MED ORDER — DEXTROSE 5 % IV SOLN
2.0000 g | Freq: Once | INTRAVENOUS | Status: AC
  Administered 2013-12-08: 2 g via INTRAVENOUS
  Filled 2013-12-08: qty 2

## 2013-12-08 MED ORDER — ACETAMINOPHEN 325 MG PO TABS: 650.0000 mg | ORAL_TABLET | Freq: Four times a day (QID) | ORAL | Status: DC | PRN

## 2013-12-08 NOTE — ED Notes (Signed)
PA at bedside.

## 2013-12-08 NOTE — ED Notes (Signed)
PA notified of upward trending temperature.

## 2013-12-08 NOTE — ED Notes (Addendum)
Pt reporting hives to chest, arms, and back starting NOW post antibiotic administration. Harris PA at bedside. Pt denies CP or SOB.

## 2013-12-08 NOTE — ED Notes (Addendum)
Pt has surgery on ankle 2 weeks ago is suppose to get cast and stitches removed tomorrow. Last night pt began to feel sick as far as fever chills and feeling weak and dizzy. Pt c/o nausea but no v/d.

## 2013-12-08 NOTE — ED Notes (Addendum)
Pt reports fever and fatigue for 2 days. Pt denies pain, SOB, or CP. Pt reports WNL for GU/GI. Pt denies n/v/d.  Pt denies complaint of surgical site. Pulse equal and strong bilaterally.

## 2013-12-08 NOTE — H&P (Signed)
History and Physical  Cian Costanzo PPI:[id number] DOB: 1950/09/13 DOA: 12/08/2013  Referring physician: Margarita Mail, PA-C PCP: No PCP Per Patient   Chief Complaint: fever  HPI:  63 year old man status post open reduction internal fixation right ankle May 26 presented with two day history of fatigue, generalized weakness, chills, poor oral appetite. Initial evaluation suggested early sepsis, UTI.  The patient reports he has been doing well status post surgery with no ankle pain. For the last 48 hours he developed generalized lethargy, weakness, poor appetite and subjective fever with diaphoresis requiring him to change bed linens. He has had no cough, abdominal pain, vomiting, shortness of breath or other complaints. Generally quite healthy with no history of UTI.  In the emergency department Tm 102.2, hypertensive, no hypoxia.lactic acid normal, WBC 13.3, urinalysis grossly positive, urine culture sent. Films of the ankle appear unremarkable. He was given cefepime and developed a rash.  Review of Systems:  Negative for visual changes, sore throat, rash, new muscle aches, chest pain, SOB, dysuria, bleeding, n/v/abdominal pain. No neck pain.  Past Medical History  Diagnosis Date  . Arthritis     Past Surgical History  Procedure Laterality Date  . Appendectomy    . C5 disc surgery   1989  . Orif ankle fracture Right 11/24/2013    Procedure: OPEN REDUCTION INTERNAL FIXATION (ORIF) RIGHT ANKLE;  Surgeon: [name], MD;  Location: [facility];  Service: Orthopedics;  Laterality: Right;    Social History:  reports that he quit smoking about 15 years ago. He has never used smokeless tobacco. He reports that he drinks about 3 ounces of alcohol per week. He reports that he does not use illicit drugs.  No Known Allergies  History reviewed. No pertinent family history. Patient denies any particular medical problems in his family.  Prior to Admission medications   Medication Sig Start  Date End Date Taking? Authorizing Provider  docusate sodium (COLACE) 100 MG capsule Take 1 capsule (100 mg total) by mouth 2 (two) times daily. 11/24/13  Yes Conley Rolls. Bissell, PA-C  methocarbamol (ROBAXIN) 500 MG tablet Take 1 tablet (500 mg total) by mouth every 6 (six) hours as needed for muscle spasms. 11/24/13  Yes Conley Rolls. Bissell, PA-C  oxyCODONE-acetaminophen (PERCOCET/ROXICET) 5-325 MG per tablet Take 1 tablet by mouth every 4 (four) hours as needed for severe pain.   Yes Historical Provider, MD   Physical Exam: Filed Vitals:   12/08/13 1444 12/08/13 1500 12/08/13 1530 12/08/13 1603  BP:  153/77 172/74 161/68  Pulse:  91 93 97  Temp: 101.1 F (38.4 C)   102.2 F (39 C)  TempSrc: Oral   Oral  Resp:    18  SpO2:  97% 96% 95%   General: Examined in the emergency department. Appears calm and comfortable Eyes: PERRL, normal lids, irises  ENT: Somewhat hard of hearing. Lips and tongue appear unremarkable. Neck: no LAD, masses or thyromegaly Cardiovascular: RRR, no m/r/g. No LE edema. Respiratory: CTA bilaterally, no w/r/r. Normal respiratory effort. Abdomen: soft, ntnd Skin: Few patches of erythema in upper extremities and chest. Musculoskeletal: grossly normal tone BUE/BLE. The right ankle appears to be healing well. Psychiatric: grossly normal mood and affect, speech fluent and appropriate Neurologic: grossly non-focal.  Wt Readings from Last 3 Encounters:  11/24/13 70.761 kg (156 lb)  11/24/13 70.761 kg (156 lb)  11/23/13 70.761 kg (156 lb)    Labs on Admission:  Basic Metabolic Panel:  Recent Labs Lab 12/08/13 1305  NA  131*  K 4.3  CL 94*  CO2 22  GLUCOSE 154*  BUN 13  CREATININE 1.15  CALCIUM 9.3    Liver Function Tests:  Recent Labs Lab 12/08/13 1305  AST 15  ALT 15  ALKPHOS 51  BILITOT 0.9  PROT 7.8  ALBUMIN 3.4*    CBC:  Recent Labs Lab 12/08/13 1305  WBC 13.3*  NEUTROABS 11.4*  HGB 13.6  HCT 39.5  MCV 87.4  PLT 211     Radiological Exams on Admission: Dg Ankle Complete Right  12/08/2013   CLINICAL DATA:  Postoperative fixation for fractures with fever  EXAM: RIGHT ANKLE - COMPLETE 3+ VIEW  COMPARISON:  Nov 17, 2013  FINDINGS: Frontal, oblique, and lateral views were obtained. There is a screw transfixing a fracture of the medial malleolus. Alignment is near anatomic at the fracture site. A portion of the head of the screw is in the soft tissues just medial to the medial malleolus. There is screw and plate fixation through the fracture of the distal fibula with alignment near anatomic at the fracture site.  The ankle mortise appears intact.  No appreciable joint effusion.  IMPRESSION: Postoperative changes with surgical fixation of fractures of the distal fibula and medial malleolus. No bony destruction seen. Ankle mortise appears intact. Alignment at the fracture sites is near anatomic.   Electronically Signed   By: Lowella Grip M.D.   On: 12/08/2013 14:54   Dg Foot Complete Right  12/08/2013   CLINICAL DATA:  Fever.  EXAM: RIGHT FOOT COMPLETE - 3+ VIEW  COMPARISON:  11/17/2013  FINDINGS: The patient is status post ORIF of bimalleolar ankle fractures. No new fractures identified. No bone erosions noted to suggest osteomyelitis.  IMPRESSION: 1. Status post ORIF of bimalleolar ankle fracture.   Electronically Signed   By: Kerby Moors M.D.   On: 12/08/2013 14:25      Principal Problem:   Sepsis secondary to UTI Active Problems:   Sepsis   UTI (lower urinary tract infection)   Dehydration   Assessment/Plan 1. UTI with suspected sepsis. 2. Dehydration.   Hemodynamically stable, nontoxic, plan admission to medical floor.  Empiric antibiotics, follow-up blood and urine cultures.  IV fluids.  Discussed with wife at bedside.  Code Status: full code  DVT prophylaxis: Lovenox Family Communication:  Disposition Plan/Anticipated LOS: admit, 2-3 days  Time spent: 60 minutes  Murray Hodgkins,  MD  [facility] Pager [phone] 12/08/2013, 4:18 PM

## 2013-12-08 NOTE — ED Notes (Signed)
PA at bedside removing cast.

## 2013-12-08 NOTE — ED Notes (Signed)
Report given to [facility]. Aware of hives post antibiotics as charted. Pt stable and VS with normal.

## 2013-12-08 NOTE — Progress Notes (Signed)
ANTIBIOTIC CONSULT NOTE - INITIAL  Pharmacy Consult for Cefepime Indication: Urosepsis  No Known Allergies  Patient Measurements:     Vital Signs: Temp: 101.1 F (38.4 C) (06/10 1444) Temp src: Oral (06/10 1444) BP: 155/67 mmHg (06/10 1400) Pulse Rate: 88 (06/10 1400) Intake/Output from previous day:   Intake/Output from this shift:    Labs:  Recent Labs  12/08/13 1305  WBC 13.3*  HGB 13.6  PLT 211  CREATININE 1.15   The CrCl is unknown because both a height and weight (above a minimum accepted value) are required for this calculation. 65 ml/min/1.93m2 (normalized)  No results found for this basename: VANCOTROUGH, VANCOPEAK, VANCORANDOM, GENTTROUGH, GENTPEAK, GENTRANDOM, TOBRATROUGH, TOBRAPEAK, TOBRARND, AMIKACINPEAK, AMIKACINTROU, AMIKACIN,  in the last 72 hours   Microbiology: No results found for this or any previous visit (from the past 720 hour(s)).  Medical History: Past Medical History  Diagnosis Date  . Arthritis     Medications:  Scheduled:  . naproxen  500 mg Oral Once   Infusions:  . ceFEPime (MAXIPIME) IV      Assessment: 63 yo male s/p ankle surgery 2 weeks ago presents to ED 6/10 with fever, chills and weakness. UA shows many bacteria, cultures pending. Pharmacy consulted to begin cefepime for urosepsis.   Goal of Therapy:  Eradication of infection Dose appropriate for renal function  Plan:   Cefepime 2g IV q12h  F/u renal function and adjust dosage as needed Follow up cultures / sensitivities, clinical course  Peggyann Juba, PharmD, BCPS Pager: [phone] 12/08/2013,3:28 PM

## 2013-12-08 NOTE — ED Provider Notes (Signed)
CSN: [id number]     Arrival date & time 12/08/13  1209 History   First MD Initiated Contact with Patient 12/08/13 1230     Chief Complaint  Patient presents with  . Fever  . Chills  . Weakness     (Consider location/radiation/quality/duration/timing/severity/associated sxs/prior Treatment) The history is provided by the patient, medical records and the spouse. No language interpreter was used.    This is a 63 year old male who is s/p R ankle ORIF performed by Dr. Tonita Cong on 11/23/2013 who presents with cc of chills, fever, weakness, and altered mental status. Patient sxs began 3 days ago. He had fever, chills, sweats. His wife has been out of town and neighbors have been popping and to take care of him. The patient called his wife and asked her to come home because he felt "very sick." His wife states that he is very stoic and for him to call and asked she felt that he must be quite ill. He denies any nausea, abdominal pain, vomiting, urinary symptoms, chest pain, cough. He does complain of 2 days of constipation which he thinks is secondary to his Percocet use. He denies any pain in the foot which has been operated on.  Past Medical History  Diagnosis Date  . Arthritis    Past Surgical History  Procedure Laterality Date  . Appendectomy    . C5 disc surgery   1989  . Orif ankle fracture Right 11/24/2013    Procedure: OPEN REDUCTION INTERNAL FIXATION (ORIF) RIGHT ANKLE;  Surgeon: [name], MD;  Location: [facility];  Service: Orthopedics;  Laterality: Right;   No family history on file. History  Substance Use Topics  . Smoking status: Former Smoker    Quit date: 07/01/1998  . Smokeless tobacco: Never Used  . Alcohol Use: 3.0 oz/week    5 Cans of beer per week     Comment: nightly    Review of Systems  Ten systems reviewed and are negative for acute change, except as noted in the HPI.    Allergies  Review of patient's allergies indicates no known allergies.  Home  Medications   Prior to Admission medications   Medication Sig Start Date End Date Taking? Authorizing Provider  docusate sodium (COLACE) 100 MG capsule Take 1 capsule (100 mg total) by mouth 2 (two) times daily. 11/24/13  Yes Conley Rolls. Bissell, PA-C  methocarbamol (ROBAXIN) 500 MG tablet Take 1 tablet (500 mg total) by mouth every 6 (six) hours as needed for muscle spasms. 11/24/13  Yes Conley Rolls. Bissell, PA-C  oxyCODONE-acetaminophen (PERCOCET/ROXICET) 5-325 MG per tablet Take 1 tablet by mouth every 4 (four) hours as needed for severe pain.   Yes Historical Provider, MD   BP 166/75  Pulse 89  Temp(Src) 100 F (37.8 C) (Oral)  Resp 20  SpO2 97% Physical Exam  Nursing note and vitals reviewed. Constitutional: He is oriented to person, place, and time. He appears well-developed and well-nourished. He appears ill. No distress.  HENT:  Head: Normocephalic and atraumatic.  Eyes: Conjunctivae are normal. No scleral icterus.  Neck: Normal range of motion. Neck supple.  Cardiovascular: Normal rate, regular rhythm and normal heart sounds.   Pulmonary/Chest: Effort normal and breath sounds normal. No respiratory distress.  Abdominal: Soft. There is no tenderness.  Musculoskeletal:  A right ankle exam was performed. The examination was performed out of splint/cast Skin: well-healing incision sites over BL maleolli Swelling: minimal Warmth: mildly increased warmth Tenderness: mild Neurological Exam: normal Vascular  Exam: normal No signs of infection  Neurological: He is alert and oriented to person, place, and time.  Skin: Skin is warm and dry. He is not diaphoretic.  Psychiatric: His behavior is normal.    ED Course  Procedures (including critical care time) Labs Review Labs Reviewed  CBC WITH DIFFERENTIAL - Abnormal; Notable for the following:    WBC 13.3 (*)    Neutrophils Relative % 86 (*)    Neutro Abs 11.4 (*)    Lymphocytes Relative 4 (*)    Lymphs Abs 0.6 (*)    Monocytes  Absolute 1.3 (*)    All other components within normal limits  COMPREHENSIVE METABOLIC PANEL - Abnormal; Notable for the following:    Sodium 131 (*)    Chloride 94 (*)    Glucose, Bld 154 (*)    Albumin 3.4 (*)    GFR calc non Af Amer 65 (*)    GFR calc Af Amer 76 (*)    All other components within normal limits  URINALYSIS, ROUTINE W REFLEX MICROSCOPIC - Abnormal; Notable for the following:    APPearance CLOUDY (*)    Hgb urine dipstick SMALL (*)    Protein, ur 30 (*)    Leukocytes, UA SMALL (*)    All other components within normal limits  URINE MICROSCOPIC-ADD ON - Abnormal; Notable for the following:    Bacteria, UA MANY (*)    All other components within normal limits  CULTURE, BLOOD (ROUTINE X 2)  CULTURE, BLOOD (ROUTINE X 2)  URINE CULTURE  I-STAT CG4 LACTIC ACID, ED    Imaging Review Dg Ankle Complete Right  12/08/2013   CLINICAL DATA:  Postoperative fixation for fractures with fever  EXAM: RIGHT ANKLE - COMPLETE 3+ VIEW  COMPARISON:  Nov 17, 2013  FINDINGS: Frontal, oblique, and lateral views were obtained. There is a screw transfixing a fracture of the medial malleolus. Alignment is near anatomic at the fracture site. A portion of the head of the screw is in the soft tissues just medial to the medial malleolus. There is screw and plate fixation through the fracture of the distal fibula with alignment near anatomic at the fracture site.  The ankle mortise appears intact.  No appreciable joint effusion.  IMPRESSION: Postoperative changes with surgical fixation of fractures of the distal fibula and medial malleolus. No bony destruction seen. Ankle mortise appears intact. Alignment at the fracture sites is near anatomic.   Electronically Signed   By: Lowella Grip M.D.   On: 12/08/2013 14:54   Dg Foot Complete Right  12/08/2013   CLINICAL DATA:  Fever.  EXAM: RIGHT FOOT COMPLETE - 3+ VIEW  COMPARISON:  11/17/2013  FINDINGS: The patient is status post ORIF of bimalleolar ankle  fractures. No new fractures identified. No bone erosions noted to suggest osteomyelitis.  IMPRESSION: 1. Status post ORIF of bimalleolar ankle fracture.   Electronically Signed   By: Kerby Moors M.D.   On: 12/08/2013 14:25     EKG Interpretation None      SUTURE REMOVAL Performed by: Margarita Mail  Consent: Verbal consent obtained. Patient identity confirmed: provided demographic data Time out: Immediately prior to procedure a "time out" was called to verify the correct patient, procedure, equipment, support staff and site/side marked as required.  Location details: R ankle  Wound Appearance: clean, healing well Sutures/Staples Removed  Facility: staples placed in OR Patient tolerance: Patient tolerated the procedure well with no immediate complications.   SPLINT APPLICATION Date/Time: 3:21  PM Authorized by: Margarita Mail Consent: Verbal consent obtained. Risks and benefits: risks, benefits and alternatives were discussed Consent given by: patient Splint applied by: orthopedic technician Location details: R ankle Splint type: posterior short leg Supplies used: webril,  Post-procedure: The splinted body part was neurovascularly unchanged following the procedure. Patient tolerance: Patient tolerated the procedure well with no immediate complications.     MDM  Presents to emergency department in early sepsis. Workup begun. He is tachycardic and febrile. His exam is benign and source is yet to be identified. Labs are pending. The patient's x-rays are negative for any suggestion of osteomyelitis. His surgical sites appear to be healing well. Fluids are being given.  3:32 PM BP 155/67  Pulse 88  Temp(Src) 101.1 F (38.4 C) (Oral)  Resp 20  SpO2 99% Patient with apparent urinary tract infection. This is likely hospital acquired secondary to catheterization? during surgery. He is  In early urosepsis. Patient being treated with cefepime. Patient informed of lab  findings. He'll call for admission to the patient. I have a consult pending with his orthopedic group as I did remove his splint.    3:42 PM Patient admitted by Dr. Sarajane Jews. Patient had outbreak of hives after cefepime given. He was given IV benadryl with complete resolution. No airway involvement   4:45 PM I spoke with Dr Reather Littler PA, Lacie Draft, PA-C  who states that staples may be removed.  Steri strips applied. Placed in posterior splint.   The patient appears reasonably stabilized for admission considering the current resources, flow, and capabilities available in the ED at this time, and I doubt any other [facility] requiring further screening and/or treatment in the ED prior to admission.  I personally reviewed the imaging tests through PACS system. I have reviewed and interpreted Lab values. I reviewed available ER/hospitalization records through the EMR    Patient / Family / Caregiver informed of clinical course, understand medical decision-making process, and agree with plan.   Margarita Mail, PA-C 12/09/13 2056

## 2013-12-08 NOTE — ED Notes (Signed)
Pt was provided Urinal. Explained we needed sample and to notify when he was able to void

## 2013-12-08 NOTE — ED Notes (Signed)
Ortho tech called and verbalized will apply splint.

## 2013-12-09 DIAGNOSIS — A419 Sepsis, unspecified organism: Secondary | ICD-10-CM

## 2013-12-09 LAB — BASIC METABOLIC PANEL
BUN: 13 mg/dL (ref 6–23)
CHLORIDE: 96 meq/L (ref 96–112)
CO2: 22 meq/L (ref 19–32)
Calcium: 8.8 mg/dL (ref 8.4–10.5)
Creatinine, Ser: 1.25 mg/dL (ref 0.50–1.35)
GFR calc Af Amer: 69 mL/min — ABNORMAL LOW (ref >90)
GFR calc non Af Amer: 59 mL/min — ABNORMAL LOW (ref >90)
Glucose, Bld: 143 mg/dL — ABNORMAL HIGH (ref 70–99)
POTASSIUM: 4 meq/L (ref 3.7–5.3)
SODIUM: 132 meq/L — AB (ref 137–147)

## 2013-12-09 LAB — CBC
HCT: 35.2 % — ABNORMAL LOW (ref 39.0–52.0)
HEMOGLOBIN: 11.8 g/dL — AB (ref 13.0–17.0)
MCH: 29.4 pg (ref 26.0–34.0)
MCHC: 33.5 g/dL (ref 30.0–36.0)
MCV: 87.8 fL (ref 78.0–100.0)
Platelets: 200 10*3/uL (ref 150–400)
RBC: 4.01 MIL/uL — AB (ref 4.22–5.81)
RDW: 12.8 % (ref 11.5–15.5)
WBC: 11.9 10*3/uL — AB (ref 4.0–10.5)

## 2013-12-09 MED ORDER — ZOLPIDEM TARTRATE 5 MG PO TABS
5.0000 mg | ORAL_TABLET | Freq: Every evening | ORAL | Status: DC | PRN
  Administered 2013-12-09 – 2013-12-10 (×2): 5 mg via ORAL
  Filled 2013-12-09 (×2): qty 1

## 2013-12-09 NOTE — Progress Notes (Signed)
[facility] PROGRESS NOTE  Joran Kallal SNK:[id number] DOB: Dec 15, 1950 DOA: 12/08/2013 PCP: No PCP Per Patient  Assessment/Plan: Principal Problem:   Sepsis secondary to UTI - Continue ciprofloxacin - Await urine culture - Tailor antibiotic regimen once urine culture sensitivities result  Dehydration - Resolving with improved by mouth intake and IV fluids initially.   Code Status: Full Family Communication: No family at bedside Disposition Plan: Pending continued improvement in condition   Consultants:  None  Procedures:  None  Antibiotics:  As listed above  HPI/Subjective: Patient has no new complaints. Reports he feels better. No acute issues overnight reported to me by patient  Objective: Filed Vitals:   12/09/13 1357  BP: 152/80  Pulse: 81  Temp: 99.7 F (37.6 C)  Resp: 18    Intake/Output Summary (Last 24 hours) at 12/09/13 1850 Last data filed at 12/09/13 1129  Gross per 24 hour  Intake 1191.67 ml  Output    700 ml  Net 491.67 ml   Filed Weights   12/08/13 1747  Weight: 68.4 kg (150 lb 12.7 oz)    Exam:   General:  Patient in no acute distress, alert and awake  Cardiovascular: Regular rate and rhythm, no murmurs or rubs  Respiratory: Clear to auscultation, no wheezes, breath sounds bilaterally  Abdomen: Soft, nondistended  Musculoskeletal: No cyanosis or clubbing   Data Reviewed: Basic Metabolic Panel:  Recent Labs Lab 12/08/13 1305 12/09/13 0448  NA 131* 132*  K 4.3 4.0  CL 94* 96  CO2 22 22  GLUCOSE 154* 143*  BUN 13 13  CREATININE 1.15 1.25  CALCIUM 9.3 8.8   Liver Function Tests:  Recent Labs Lab 12/08/13 1305  AST 15  ALT 15  ALKPHOS 51  BILITOT 0.9  PROT 7.8  ALBUMIN 3.4*   No results found for this basename: LIPASE, AMYLASE,  in the last 168 hours No results found for this basename: AMMONIA,  in the last 168 hours CBC:  Recent Labs Lab 12/08/13 1305 12/09/13 0448  WBC 13.3* 11.9*  NEUTROABS  11.4*  --   HGB 13.6 11.8*  HCT 39.5 35.2*  MCV 87.4 87.8  PLT 211 200   Cardiac Enzymes: No results found for this basename: CKTOTAL, CKMB, CKMBINDEX, TROPONINI,  in the last 168 hours BNP (last 3 results) No results found for this basename: PROBNP,  in the last 8760 hours CBG: No results found for this basename: GLUCAP,  in the last 168 hours  Recent Results (from the past 240 hour(s))  CULTURE, BLOOD (ROUTINE X 2)     Status: None   Collection Time    12/08/13  1:05 PM      Result Value Ref Range Status   Specimen Description BLOOD LEFT ANTECUBITAL   Final   Special Requests BOTTLES DRAWN AEROBIC AND ANAEROBIC 2.5 CC EA   Final   Culture  Setup Time     Final   Value: 12/08/2013 16:29     Performed at [facility]   Culture     Final   Value:        BLOOD CULTURE RECEIVED NO GROWTH TO DATE CULTURE WILL BE HELD FOR 5 DAYS BEFORE ISSUING A FINAL NEGATIVE REPORT     Performed at [facility]   Report Status PENDING   Incomplete  CULTURE, BLOOD (ROUTINE X 2)     Status: None   Collection Time    12/08/13  1:30 PM      Result Value Ref  Range Status   Specimen Description BLOOD RIGHT ANTECUBITAL   Final   Special Requests BOTTLES DRAWN AEROBIC AND ANAEROBIC 4ML   Final   Culture  Setup Time     Final   Value: 12/08/2013 16:29     Performed at [facility]   Culture     Final   Value:        BLOOD CULTURE RECEIVED NO GROWTH TO DATE CULTURE WILL BE HELD FOR 5 DAYS BEFORE ISSUING A FINAL NEGATIVE REPORT     Performed at [facility]   Report Status PENDING   Incomplete     Studies: Dg Ankle Complete Right  12/08/2013   CLINICAL DATA:  Postoperative fixation for fractures with fever  EXAM: RIGHT ANKLE - COMPLETE 3+ VIEW  COMPARISON:  Nov 17, 2013  FINDINGS: Frontal, oblique, and lateral views were obtained. There is a screw transfixing a fracture of the medial malleolus. Alignment is near anatomic at the fracture site. A portion of the head of the  screw is in the soft tissues just medial to the medial malleolus. There is screw and plate fixation through the fracture of the distal fibula with alignment near anatomic at the fracture site.  The ankle mortise appears intact.  No appreciable joint effusion.  IMPRESSION: Postoperative changes with surgical fixation of fractures of the distal fibula and medial malleolus. No bony destruction seen. Ankle mortise appears intact. Alignment at the fracture sites is near anatomic.   Electronically Signed   By: Lowella Grip M.D.   On: 12/08/2013 14:54   Dg Foot Complete Right  12/08/2013   CLINICAL DATA:  Fever.  EXAM: RIGHT FOOT COMPLETE - 3+ VIEW  COMPARISON:  11/17/2013  FINDINGS: The patient is status post ORIF of bimalleolar ankle fractures. No new fractures identified. No bone erosions noted to suggest osteomyelitis.  IMPRESSION: 1. Status post ORIF of bimalleolar ankle fracture.   Electronically Signed   By: Kerby Moors M.D.   On: 12/08/2013 14:25    Scheduled Meds: . ciprofloxacin  400 mg Intravenous Q12H  . docusate sodium  100 mg Oral BID  . enoxaparin (LOVENOX) injection  40 mg Subcutaneous Q24H   Continuous Infusions: . sodium chloride 125 mL/hr at 12/08/13 2104    Time spent: > 35 minutes    Velvet Bathe  [facility] Pager [phone] If 7PM-7AM, please contact night-coverage at [email or web address], password [facility] 12/09/2013, 6:50 PM  LOS: 1 day

## 2013-12-09 NOTE — Discharge Instructions (Signed)
Remain non weightbearing R leg Allow steri strips to fall off, may remove in 1 week if they do not fall off on their own CAM walker (brace) R ankle, may remove to shower (remain Non weightbearing), ice and elevate  No soaking (bath/pool) until skin is fully healed Take Aspirin 81mg  daily while nonweightbearing to prevent blood clots Follow up in 4 weeks in the office with Dr. Tonita Cong for repeat xrays

## 2013-12-09 NOTE — Consult Note (Signed)
Reason for Consult:s/p R ankle ORIF Referring Physician: EDP  Derek Browning is an 63 y.o. male.  HPI: Pt is 2 weeks s/p ORIF R ankle bimalleolar fx by Dr. Tonita Cong. He had an uncomplicated overnight stay post-op, no foley was placed, no urinary retention issues noted. He was due to follow up in our office today. He had called yesterday c/o fatigue, fever, and his appt was moved up but he could not wait and he presented to the ER, where he was found to be in urosepsis. He denies pain in the ankle. Xrays were taken, cast was removed, staples were removed with no sign of infx at the ankle. Pt seen by myself and Dr. Tonita Cong.  Past Medical History  Diagnosis Date  . Arthritis     Past Surgical History  Procedure Laterality Date  . Appendectomy    . C5 disc surgery   1989  . Orif ankle fracture Right 11/24/2013    Procedure: OPEN REDUCTION INTERNAL FIXATION (ORIF) RIGHT ANKLE;  Surgeon: [name], MD;  Location: [facility];  Service: Orthopedics;  Laterality: Right;    History reviewed. No pertinent family history.  Social History:  reports that he quit smoking about 15 years ago. He has never used smokeless tobacco. He reports that he drinks about 3 ounces of alcohol per week. He reports that he does not use illicit drugs.  Allergies:  Allergies  Allergen Reactions  . Cefepime Rash    No swelling or difficulty breathing    Medications: I have reviewed the patient's current medications.  Results for orders placed during the hospital encounter of 12/08/13 (from the past 48 hour(s))  CBC WITH DIFFERENTIAL     Status: Abnormal   Collection Time    12/08/13  1:05 PM      Result Value Ref Range   WBC 13.3 (*) 4.0 - 10.5 K/uL   RBC 4.52  4.22 - 5.81 MIL/uL   Hemoglobin 13.6  13.0 - 17.0 g/dL   HCT 39.5  39.0 - 52.0 %   MCV 87.4  78.0 - 100.0 fL   MCH 30.1  26.0 - 34.0 pg   MCHC 34.4  30.0 - 36.0 g/dL   RDW 12.9  11.5 - 15.5 %   Platelets 211  150 - 400 K/uL   Neutrophils Relative % 86  (*) 43 - 77 %   Neutro Abs 11.4 (*) 1.7 - 7.7 K/uL   Lymphocytes Relative 4 (*) 12 - 46 %   Lymphs Abs 0.6 (*) 0.7 - 4.0 K/uL   Monocytes Relative 10  3 - 12 %   Monocytes Absolute 1.3 (*) 0.1 - 1.0 K/uL   Eosinophils Relative 0  0 - 5 %   Eosinophils Absolute 0.0  0.0 - 0.7 K/uL   Basophils Relative 0  0 - 1 %   Basophils Absolute 0.0  0.0 - 0.1 K/uL  COMPREHENSIVE METABOLIC PANEL     Status: Abnormal   Collection Time    12/08/13  1:05 PM      Result Value Ref Range   Sodium 131 (*) 137 - 147 mEq/L   Potassium 4.3  3.7 - 5.3 mEq/L   Chloride 94 (*) 96 - 112 mEq/L   CO2 22  19 - 32 mEq/L   Glucose, Bld 154 (*) 70 - 99 mg/dL   BUN 13  6 - 23 mg/dL   Creatinine, Ser 1.15  0.50 - 1.35 mg/dL   Calcium 9.3  8.4 - 10.5 mg/dL  Total Protein 7.8  6.0 - 8.3 g/dL   Albumin 3.4 (*) 3.5 - 5.2 g/dL   AST 15  0 - 37 U/L   ALT 15  0 - 53 U/L   Alkaline Phosphatase 51  39 - 117 U/L   Total Bilirubin 0.9  0.3 - 1.2 mg/dL   GFR calc non Af Amer 65 (*) >90 mL/min   GFR calc Af Amer 76 (*) >90 mL/min   Comment: (NOTE)     The eGFR has been calculated using the CKD EPI equation.     This calculation has not been validated in all clinical situations.     eGFR's persistently <90 mL/min signify possible Chronic Kidney     Disease.  I-STAT CG4 LACTIC ACID, ED     Status: None   Collection Time    12/08/13  1:24 PM      Result Value Ref Range   Lactic Acid, Venous 1.73  0.5 - 2.2 mmol/L  URINALYSIS, ROUTINE W REFLEX MICROSCOPIC     Status: Abnormal   Collection Time    12/08/13  2:21 PM      Result Value Ref Range   Color, Urine YELLOW  YELLOW   APPearance CLOUDY (*) CLEAR   Specific Gravity, Urine 1.011  1.005 - 1.030   pH 6.5  5.0 - 8.0   Glucose, UA NEGATIVE  NEGATIVE mg/dL   Hgb urine dipstick SMALL (*) NEGATIVE   Bilirubin Urine NEGATIVE  NEGATIVE   Ketones, ur NEGATIVE  NEGATIVE mg/dL   Protein, ur 30 (*) NEGATIVE mg/dL   Urobilinogen, UA 1.0  0.0 - 1.0 mg/dL   Nitrite NEGATIVE   NEGATIVE   Leukocytes, UA SMALL (*) NEGATIVE  URINE MICROSCOPIC-ADD ON     Status: Abnormal   Collection Time    12/08/13  2:21 PM      Result Value Ref Range   Squamous Epithelial / LPF RARE  RARE   WBC, UA 21-50  <3 WBC/hpf   RBC / HPF 0-2  <3 RBC/hpf   Bacteria, UA MANY (*) RARE  BASIC METABOLIC PANEL     Status: Abnormal   Collection Time    12/09/13  4:48 AM      Result Value Ref Range   Sodium 132 (*) 137 - 147 mEq/L   Potassium 4.0  3.7 - 5.3 mEq/L   Chloride 96  96 - 112 mEq/L   CO2 22  19 - 32 mEq/L   Glucose, Bld 143 (*) 70 - 99 mg/dL   BUN 13  6 - 23 mg/dL   Creatinine, Ser 1.25  0.50 - 1.35 mg/dL   Calcium 8.8  8.4 - 10.5 mg/dL   GFR calc non Af Amer 59 (*) >90 mL/min   GFR calc Af Amer 69 (*) >90 mL/min   Comment: (NOTE)     The eGFR has been calculated using the CKD EPI equation.     This calculation has not been validated in all clinical situations.     eGFR's persistently <90 mL/min signify possible Chronic Kidney     Disease.  CBC     Status: Abnormal   Collection Time    12/09/13  4:48 AM      Result Value Ref Range   WBC 11.9 (*) 4.0 - 10.5 K/uL   RBC 4.01 (*) 4.22 - 5.81 MIL/uL   Hemoglobin 11.8 (*) 13.0 - 17.0 g/dL   HCT 35.2 (*) 39.0 - 52.0 %   MCV 87.8  78.0 - 100.0 fL   MCH 29.4  26.0 - 34.0 pg   MCHC 33.5  30.0 - 36.0 g/dL   RDW 12.8  11.5 - 15.5 %   Platelets 200  150 - 400 K/uL    Dg Ankle Complete Right  12/08/2013   CLINICAL DATA:  Postoperative fixation for fractures with fever  EXAM: RIGHT ANKLE - COMPLETE 3+ VIEW  COMPARISON:  Nov 17, 2013  FINDINGS: Frontal, oblique, and lateral views were obtained. There is a screw transfixing a fracture of the medial malleolus. Alignment is near anatomic at the fracture site. A portion of the head of the screw is in the soft tissues just medial to the medial malleolus. There is screw and plate fixation through the fracture of the distal fibula with alignment near anatomic at the fracture site.  The ankle  mortise appears intact.  No appreciable joint effusion.  IMPRESSION: Postoperative changes with surgical fixation of fractures of the distal fibula and medial malleolus. No bony destruction seen. Ankle mortise appears intact. Alignment at the fracture sites is near anatomic.   Electronically Signed   By: Lowella Grip M.D.   On: 12/08/2013 14:54   Dg Foot Complete Right  12/08/2013   CLINICAL DATA:  Fever.  EXAM: RIGHT FOOT COMPLETE - 3+ VIEW  COMPARISON:  11/17/2013  FINDINGS: The patient is status post ORIF of bimalleolar ankle fractures. No new fractures identified. No bone erosions noted to suggest osteomyelitis.  IMPRESSION: 1. Status post ORIF of bimalleolar ankle fracture.   Electronically Signed   By: Kerby Moors M.D.   On: 12/08/2013 14:25    Review of Systems  Constitutional: Positive for fever.  HENT: Negative.   Eyes: Negative.   Respiratory: Negative.   Cardiovascular: Negative.   Gastrointestinal: Negative.   Genitourinary: Negative.   Musculoskeletal: Negative.   Neurological: Negative.   Psychiatric/Behavioral: Negative.    Blood pressure 149/79, pulse 85, temperature 98.8 F (37.1 C), temperature source Oral, resp. rate 18, height 5' 6" (1.676 m), weight 68.4 kg (150 lb 12.7 oz), SpO2 97.00%. Physical Exam  Constitutional: He is oriented to person, place, and time. He appears well-developed and well-nourished.  HENT:  Head: Normocephalic and atraumatic.  Eyes: Conjunctivae and EOM are normal. Pupils are equal, round, and reactive to light.  Neck: Normal range of motion. Neck supple.  Cardiovascular: Normal rate and regular rhythm.   Respiratory: Effort normal and breath sounds normal.  GI: Soft. Bowel sounds are normal.  Musculoskeletal:  R ankle medial and lateral incisions well healed, staples removed, steris in place. No erythema or drainage, no sign of infx. Pedal pulses 2+, good cap refill, NVI distally. Decreased ROM secondary to recent surgery. Mild  swelling.  Neurological: He is alert and oriented to person, place, and time. He has normal reflexes.  Skin: Skin is warm and dry.  Psychiatric: He has a normal mood and affect.   Splint in place RLE  Assessment/Plan: 2 weeks s/p ORIF R ankle- doing well Urosepsis- admitted on medical service  xrays reviewed by Dr. Tonita Cong with no change in alignment of hardware s/p ORIF. Ankle mortise maintained.  Allow steris to fall off on their own, he may remove in 1 week if still present Keep incisions clean and dry, no soaking until skin fully closed Continue ice and elevation RLE to reduce swelling, toes above the nose Place in CAM walker for immobilization Remain NWB to allow fx to heal and prevent displacement Resume ASA 45m daily for  DVT ppx on D/C Follow up outpt in office in 4 weeks for repeat xrays at which point anticipate change in weightbearing status  BISSELL, JACLYN M. 12/09/2013, 10:02 AM

## 2013-12-10 LAB — URINE CULTURE: Colony Count: >=100000

## 2013-12-10 NOTE — ED Provider Notes (Signed)
Medical screening examination/treatment/procedure(s) were conducted as a shared visit with non-physician practitioner(s) and myself.  I personally evaluated the patient during the encounter.   EKG Interpretation None      Patient presents today for for fever and chills. She recently had surgery for tib-fib fracture. Surgical site, however, appears to be healing well without any concern for surgical site infection. Workup is evidence of urinary tract infection. Patient does have some early systemic symptoms, likely early sepsis secondary to UTI. Will require admission.  Orpah Greek, MD 12/10/13 1539

## 2013-12-10 NOTE — Progress Notes (Signed)
[facility] PROGRESS NOTE  Derek Browning SHF:[id number] DOB: January 16, 1951 DOA: 12/08/2013 PCP: No PCP Per Patient  Assessment/Plan: Principal Problem:   Sepsis secondary to UTI - Continue ciprofloxacin - Await urine culture - Tailor antibiotic regimen once urine culture sensitivities result  Dehydration - Resolvied with improved by mouth intake and IV fluids initially.  Code Status: Full Family Communication: No family at bedside Disposition Plan: Pending continued improvement in condition   Consultants:  None  Procedures:  None  Antibiotics:  As listed above  HPI/Subjective: Patient has no new complaints. Reports he feels better. Discussed with patient and spouse at bedside and answered questions to their satisfaction  Objective: Filed Vitals:   12/10/13 0551  BP: 151/86  Pulse: 77  Temp: 98.6 F (37 C)  Resp: 18    Intake/Output Summary (Last 24 hours) at 12/10/13 1206 Last data filed at 12/10/13 0551  Gross per 24 hour  Intake   3275 ml  Output    900 ml  Net   2375 ml   Filed Weights   12/08/13 1747  Weight: 68.4 kg (150 lb 12.7 oz)    Exam:   General:  Patient in no acute distress, alert and awake  Cardiovascular: Regular rate and rhythm, no murmurs or rubs  Respiratory: Clear to auscultation, no wheezes, breath sounds bilaterally  Abdomen: Soft, nondistended  Musculoskeletal: No cyanosis or clubbing   Data Reviewed: Basic Metabolic Panel:  Recent Labs Lab 12/08/13 1305 12/09/13 0448  NA 131* 132*  K 4.3 4.0  CL 94* 96  CO2 22 22  GLUCOSE 154* 143*  BUN 13 13  CREATININE 1.15 1.25  CALCIUM 9.3 8.8   Liver Function Tests:  Recent Labs Lab 12/08/13 1305  AST 15  ALT 15  ALKPHOS 51  BILITOT 0.9  PROT 7.8  ALBUMIN 3.4*   No results found for this basename: LIPASE, AMYLASE,  in the last 168 hours No results found for this basename: AMMONIA,  in the last 168 hours CBC:  Recent Labs Lab 12/08/13 1305 12/09/13 0448   WBC 13.3* 11.9*  NEUTROABS 11.4*  --   HGB 13.6 11.8*  HCT 39.5 35.2*  MCV 87.4 87.8  PLT 211 200   Cardiac Enzymes: No results found for this basename: CKTOTAL, CKMB, CKMBINDEX, TROPONINI,  in the last 168 hours BNP (last 3 results) No results found for this basename: PROBNP,  in the last 8760 hours CBG: No results found for this basename: GLUCAP,  in the last 168 hours  Recent Results (from the past 240 hour(s))  CULTURE, BLOOD (ROUTINE X 2)     Status: None   Collection Time    12/08/13  1:05 PM      Result Value Ref Range Status   Specimen Description BLOOD LEFT ANTECUBITAL   Final   Special Requests BOTTLES DRAWN AEROBIC AND ANAEROBIC 2.5 CC EA   Final   Culture  Setup Time     Final   Value: 12/08/2013 16:29     Performed at [facility]   Culture     Final   Value:        BLOOD CULTURE RECEIVED NO GROWTH TO DATE CULTURE WILL BE HELD FOR 5 DAYS BEFORE ISSUING A FINAL NEGATIVE REPORT     Performed at [facility]   Report Status PENDING   Incomplete  CULTURE, BLOOD (ROUTINE X 2)     Status: None   Collection Time    12/08/13  1:30 PM  Result Value Ref Range Status   Specimen Description BLOOD RIGHT ANTECUBITAL   Final   Special Requests BOTTLES DRAWN AEROBIC AND ANAEROBIC 4ML   Final   Culture  Setup Time     Final   Value: 12/08/2013 16:29     Performed at [facility]   Culture     Final   Value:        BLOOD CULTURE RECEIVED NO GROWTH TO DATE CULTURE WILL BE HELD FOR 5 DAYS BEFORE ISSUING A FINAL NEGATIVE REPORT     Performed at [facility]   Report Status PENDING   Incomplete  URINE CULTURE     Status: None   Collection Time    12/08/13  2:21 PM      Result Value Ref Range Status   Specimen Description URINE, CLEAN CATCH   Final   Special Requests NONE   Final   Culture  Setup Time     Final   Value: 12/08/2013 22:37     Performed at [place]     Final   Value: >=100,000 COLONIES/ML      Performed at [facility]   Culture     Final   Value: ESCHERICHIA COLI     Performed at [facility]   Report Status PENDING   Incomplete     Studies: Dg Ankle Complete Right  12/08/2013   CLINICAL DATA:  Postoperative fixation for fractures with fever  EXAM: RIGHT ANKLE - COMPLETE 3+ VIEW  COMPARISON:  Nov 17, 2013  FINDINGS: Frontal, oblique, and lateral views were obtained. There is a screw transfixing a fracture of the medial malleolus. Alignment is near anatomic at the fracture site. A portion of the head of the screw is in the soft tissues just medial to the medial malleolus. There is screw and plate fixation through the fracture of the distal fibula with alignment near anatomic at the fracture site.  The ankle mortise appears intact.  No appreciable joint effusion.  IMPRESSION: Postoperative changes with surgical fixation of fractures of the distal fibula and medial malleolus. No bony destruction seen. Ankle mortise appears intact. Alignment at the fracture sites is near anatomic.   Electronically Signed   By: Lowella Grip M.D.   On: 12/08/2013 14:54   Dg Foot Complete Right  12/08/2013   CLINICAL DATA:  Fever.  EXAM: RIGHT FOOT COMPLETE - 3+ VIEW  COMPARISON:  11/17/2013  FINDINGS: The patient is status post ORIF of bimalleolar ankle fractures. No new fractures identified. No bone erosions noted to suggest osteomyelitis.  IMPRESSION: 1. Status post ORIF of bimalleolar ankle fracture.   Electronically Signed   By: Kerby Moors M.D.   On: 12/08/2013 14:25    Scheduled Meds: . ciprofloxacin  400 mg Intravenous Q12H  . docusate sodium  100 mg Oral BID  . enoxaparin (LOVENOX) injection  40 mg Subcutaneous Q24H   Continuous Infusions: . sodium chloride 125 mL/hr at 12/08/13 2104    Time spent: > 35 minutes    Velvet Bathe  [facility] Pager [phone] If 7PM-7AM, please contact night-coverage at [email or web address], password [facility] 12/10/2013, 12:06 PM  LOS: 2  days

## 2013-12-11 DIAGNOSIS — E871 Hypo-osmolality and hyponatremia: Secondary | ICD-10-CM

## 2013-12-11 MED ORDER — CIPROFLOXACIN HCL 500 MG PO TABS: 500.0000 mg | ORAL_TABLET | Freq: Two times a day (BID) | ORAL | Status: DC

## 2013-12-11 MED ORDER — CIPROFLOXACIN HCL 500 MG PO TABS
500.0000 mg | ORAL_TABLET | Freq: Two times a day (BID) | ORAL | Status: DC
  Filled 2013-12-11 (×2): qty 1

## 2013-12-11 NOTE — Discharge Summary (Signed)
Physician Discharge Summary  Derek Browning AJO:[id number] DOB: [birth year] DOA: 12/08/2013  PCP: No PCP Per Patient  Admit date: 12/08/2013 Discharge date: 12/11/2013  Time spent: > 35 minutes  Recommendations for Outpatient Follow-up:  1. Follow up with your primary care physician in 1-2 weeks or sooner should any new concerns arise.  Discharge Diagnoses:  Please see list under Hospital course  Discharge Condition: Stable  Diet recommendation: Regular diet  Filed Weights   12/08/13 1747  Weight: 68.4 kg (150 lb 12.7 oz)    History of present illness:  63 year old who had recent orthopedic procedure and developed UTI with sepsis  Hospital Course:  Principal Problem:   Sepsis secondary to UTI - Patient grew out Escherichia coli which was sensitive to ciprofloxacin. - His condition improved with IV antibiotics and ciprofloxacin.  Active Problems:   UTI (lower urinary tract infection) - Urine culture results described above. - Will treat for seven-day antibiotic course    Dehydration - Resolved after improvement in oral intake and IV fluids initially.  Hyponatremia - Suspect should continue to improve with improvement in oral intake  Procedures:  None  Consultations:  None  Discharge Exam: Filed Vitals:   12/11/13 0603  BP: 153/85  Pulse: 66  Temp: 98.2 F (36.8 C)  Resp: 18    General: Pt in NAD, alert and awake Cardiovascular: RRR, no mrg Respiratory: CTA BL, no wheezes  Discharge Instructions You were cared for by a hospitalist during your hospital stay. If you have any questions about your discharge medications or the care you received while you were in the hospital after you are discharged, you can call the unit and asked to speak with the hospitalist on call if the hospitalist that took care of you is not available. Once you are discharged, your primary care physician will handle any further medical issues. Please note that NO REFILLS for any discharge  medications will be authorized once you are discharged, as it is imperative that you return to your primary care physician (or establish a relationship with a primary care physician if you do not have one) for your aftercare needs so that they can reassess your need for medications and monitor your lab values.  Discharge Instructions   Call MD for:  extreme fatigue    Complete by:  As directed      Call MD for:  severe uncontrolled pain    Complete by:  As directed      Call MD for:  temperature >100.4    Complete by:  As directed      Diet - low sodium heart healthy    Complete by:  As directed      Increase activity slowly    Complete by:  As directed             Medication List         ciprofloxacin 500 MG tablet  Commonly known as:  CIPRO  Take 1 tablet (500 mg total) by mouth 2 (two) times daily.     docusate sodium 100 MG capsule  Commonly known as:  COLACE  Take 1 capsule (100 mg total) by mouth 2 (two) times daily.     methocarbamol 500 MG tablet  Commonly known as:  ROBAXIN  Take 1 tablet (500 mg total) by mouth every 6 (six) hours as needed for muscle spasms.     oxyCODONE-acetaminophen 5-325 MG per tablet  Commonly known as:  PERCOCET/ROXICET  Take 1 tablet by mouth every  4 (four) hours as needed for severe pain.       Allergies  Allergen Reactions  . Cefepime Rash    No swelling or difficulty breathing       Follow-up Information   Follow up with BEANE,JEFFREY C, MD In 4 weeks. (for repeat xrays)    Specialty:  Orthopedic Surgery   Contact information:   [address] [place] [address] [place] [address] [phone]        The results of significant diagnostics from this hospitalization (including imaging, microbiology, ancillary and laboratory) are listed below for reference.    Significant Diagnostic Studies: Dg Ankle 2 Views Right  11/28/13   CLINICAL DATA:  ORIF of the right ankle  EXAM: RIGHT ANKLE - 2 VIEW  COMPARISON:  None.   FINDINGS: Fluoro time is 33 seconds.  Serial fluoroscopic images demonstrate orthopedic side plate fixated by screws in the distal fibula with no malalignment. There is insertion of a screw for fixation of fracture of the medial malleolus without gross malalignment.  IMPRESSION: Serial fluoroscopic images demonstrate orthopedic side plate fixated by screws in the distal fibula with no malalignment. There is insertion of a screw for fixation of fracture of the medial malleolus without gross malalignment.   Electronically Signed   By: Abelardo Diesel M.D.   On: Nov 28, 2013 16:51   Dg Ankle Complete Right  12/08/2013   CLINICAL DATA:  Postoperative fixation for fractures with fever  EXAM: RIGHT ANKLE - COMPLETE 3+ VIEW  COMPARISON:  Nov 17, 2013  FINDINGS: Frontal, oblique, and lateral views were obtained. There is a screw transfixing a fracture of the medial malleolus. Alignment is near anatomic at the fracture site. A portion of the head of the screw is in the soft tissues just medial to the medial malleolus. There is screw and plate fixation through the fracture of the distal fibula with alignment near anatomic at the fracture site.  The ankle mortise appears intact.  No appreciable joint effusion.  IMPRESSION: Postoperative changes with surgical fixation of fractures of the distal fibula and medial malleolus. No bony destruction seen. Ankle mortise appears intact. Alignment at the fracture sites is near anatomic.   Electronically Signed   By: Lowella Grip M.D.   On: 12/08/2013 14:54   Dg Ankle Complete Right  11/17/2013   CLINICAL DATA:  ANKLE PAIN  EXAM: RIGHT ANKLE - COMPLETE 3+ VIEW  COMPARISON:  None.  FINDINGS: Comminuted fracture involving distal fibula and lateral malleolus. An avulsion fracture of the medial malleolus with lateral displacement. These fractures demonstrate intra-articular extension. The areas of widening and narrowing of the medial aspect of the talotibial joint.  IMPRESSION:  Bimalleolar ankle fracture  with disruption of the ankle mortise.   Electronically Signed   By: Margaree Mackintosh M.D.   On: 11/17/2013 21:21   Dg Foot Complete Right  12/08/2013   CLINICAL DATA:  Fever.  EXAM: RIGHT FOOT COMPLETE - 3+ VIEW  COMPARISON:  11/17/2013  FINDINGS: The patient is status post ORIF of bimalleolar ankle fractures. No new fractures identified. No bone erosions noted to suggest osteomyelitis.  IMPRESSION: 1. Status post ORIF of bimalleolar ankle fracture.   Electronically Signed   By: Kerby Moors M.D.   On: 12/08/2013 14:25   Dg C-arm 61-120 Min-no Report  28-Nov-2013   CLINICAL DATA: right ankle fracture   C-ARM 61-120 MINUTES  Fluoroscopy was utilized by the requesting physician.  No radiographic  interpretation.     Microbiology: Recent Results (  from the past 240 hour(s))  CULTURE, BLOOD (ROUTINE X 2)     Status: None   Collection Time    12/08/13  1:05 PM      Result Value Ref Range Status   Specimen Description BLOOD LEFT ANTECUBITAL   Final   Special Requests BOTTLES DRAWN AEROBIC AND ANAEROBIC 2.5 CC EA   Final   Culture  Setup Time     Final   Value: 12/08/2013 16:29     Performed at [facility]   Culture     Final   Value:        BLOOD CULTURE RECEIVED NO GROWTH TO DATE CULTURE WILL BE HELD FOR 5 DAYS BEFORE ISSUING A FINAL NEGATIVE REPORT     Performed at [facility]   Report Status PENDING   Incomplete  CULTURE, BLOOD (ROUTINE X 2)     Status: None   Collection Time    12/08/13  1:30 PM      Result Value Ref Range Status   Specimen Description BLOOD RIGHT ANTECUBITAL   Final   Special Requests BOTTLES DRAWN AEROBIC AND ANAEROBIC 4ML   Final   Culture  Setup Time     Final   Value: 12/08/2013 16:29     Performed at [facility]   Culture     Final   Value:        BLOOD CULTURE RECEIVED NO GROWTH TO DATE CULTURE WILL BE HELD FOR 5 DAYS BEFORE ISSUING A FINAL NEGATIVE REPORT     Performed at [facility]   Report  Status PENDING   Incomplete  URINE CULTURE     Status: None   Collection Time    12/08/13  2:21 PM      Result Value Ref Range Status   Specimen Description URINE, CLEAN CATCH   Final   Special Requests NONE   Final   Culture  Setup Time     Final   Value: 12/08/2013 22:37     Performed at [facility]     Final   Value: >=100,000 COLONIES/ML     Performed at [facility]   Culture     Final   Value: ESCHERICHIA COLI     Performed at [facility]   Report Status 12/10/2013 FINAL   Final   Organism ID, Bacteria ESCHERICHIA COLI   Final     Labs: Basic Metabolic Panel:  Recent Labs Lab 12/08/13 1305 12/09/13 0448  NA 131* 132*  K 4.3 4.0  CL 94* 96  CO2 22 22  GLUCOSE 154* 143*  BUN 13 13  CREATININE 1.15 1.25  CALCIUM 9.3 8.8   Liver Function Tests:  Recent Labs Lab 12/08/13 1305  AST 15  ALT 15  ALKPHOS 51  BILITOT 0.9  PROT 7.8  ALBUMIN 3.4*   No results found for this basename: LIPASE, AMYLASE,  in the last 168 hours No results found for this basename: AMMONIA,  in the last 168 hours CBC:  Recent Labs Lab 12/08/13 1305 12/09/13 0448  WBC 13.3* 11.9*  NEUTROABS 11.4*  --   HGB 13.6 11.8*  HCT 39.5 35.2*  MCV 87.4 87.8  PLT 211 200   Cardiac Enzymes: No results found for this basename: CKTOTAL, CKMB, CKMBINDEX, TROPONINI,  in the last 168 hours BNP: BNP (last 3 results) No results found for this basename: PROBNP,  in the last 8760 hours CBG: No results found  for this basename: GLUCAP,  in the last 168 hours     Signed:  Velvet Bathe  [facility] 12/11/2013, 2:26 PM

## 2013-12-11 NOTE — Progress Notes (Signed)
PHARMACIST - PHYSICIAN COMMUNICATION DR:   Wendee Beavers CONCERNING: Antibiotic IV to Oral Route Change Policy  RECOMMENDATION: This patient is receiving Cipro by the intravenous route.  Based on criteria approved by the Pharmacy and Therapeutics Committee, the antibiotic(s) is/are being converted to the equivalent oral dose form(s).   DESCRIPTION: These criteria include:  Patient being treated for a respiratory tract infection, urinary tract infection, cellulitis or clostridium difficile associated diarrhea if on metronidazole  The patient is not neutropenic and does not exhibit a GI malabsorption state  The patient is eating (either orally or via tube) and/or has been taking other orally administered medications for a least 24 hours  The patient is improving clinically and has a Tmax < 100.5  If you have questions about this conversion, please contact the Pharmacy Department  []   [phone] )  [facility] []   [phone] )  [facility]  []   [phone] )  [facility] [x]   [phone] )  [place], PharmD, BCPS 12/11/2013 10:51 AM

## 2013-12-13 NOTE — Progress Notes (Signed)
Discharge summary sent to payer through [facility]  

## 2013-12-14 LAB — CULTURE, BLOOD (ROUTINE X 2)
Culture: NO GROWTH
Culture: NO GROWTH

## 2014-02-01 ENCOUNTER — Ambulatory Visit: Payer: Self-pay | Admitting: Orthopedic Surgery

## 2014-02-01 NOTE — H&P (Signed)
Derek Browning is an 63 y.o. male.   Chief Complaint: right ankle pain  HPI: The patient is a 63 year old male presenting for a post-operative visit. Patient is 8 weeks, 6 days postop following their ORIF right bimalleolar ankle fracture.Overall the patient feels that they are doing fair. The patient does report pain and swelling Pain medications include: Percocet . The patient feels they are having no difficulty tolerating their pain medication(s).The patient states that they feel that their pain is well controlled. The patient is currently non-weightbearing with the assistance of: walker (kneeling walker). The patient is using a walking boot. He had a fall with increased pain and swelling about 4 weeks ago now. He follows up. He's had no changes in his pain medially, in the medial soft tissues.  Past Medical History  Diagnosis Date  . Arthritis     Past Surgical History  Procedure Laterality Date  . Appendectomy    . C5 disc surgery   1989  . Orif ankle fracture Right 11/24/2013    Procedure: OPEN REDUCTION INTERNAL FIXATION (ORIF) RIGHT ANKLE;  Surgeon: [name], MD;  Location: [facility];  Service: Orthopedics;  Laterality: Right;    No family history on file. Social History:  reports that he quit smoking about 15 years ago. He has never used smokeless tobacco. He reports that he drinks about 3 ounces of alcohol per week. He reports that he does not use illicit drugs.  Allergies:  Allergies  Allergen Reactions  . Cefepime Rash    No swelling or difficulty breathing     (Not in a hospital admission)  No results found for this or any previous visit (from the past 48 hour(s)). No results found.  Review of Systems  Constitutional: Negative.   HENT: Negative.   Eyes: Negative.   Respiratory: Negative.   Cardiovascular: Negative.   Gastrointestinal: Negative.   Genitourinary: Negative.   Musculoskeletal: Positive for joint pain.  Skin: Negative.   Neurological:  Negative.   Psychiatric/Behavioral: Negative.     There were no vitals taken for this visit. Physical Exam  Constitutional: He is oriented to person, place, and time. He appears well-developed and well-nourished.  HENT:  Head: Normocephalic and atraumatic.  Eyes: Conjunctivae and EOM are normal. Pupils are equal, round, and reactive to light.  Neck: Normal range of motion. Neck supple.  Cardiovascular: Normal rate and regular rhythm.   Respiratory: Effort normal and breath sounds normal.  GI: Soft. Bowel sounds are normal.  Musculoskeletal:  On exam, he has a prominence medially. He is tender over the medial malleolus. He has no evidence of infection there.  Neurological: He is alert and oriented to person, place, and time. He has normal reflexes.  Skin: Skin is warm and dry.  Psychiatric: He has a normal mood and affect.    Imaging I repeated the x-rays. There is no further callus over the medial malleolar avulsions. The fibula appears to be healed.  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 Displacement of the medial malleolar fracture.  I discussed attempted observation and living with it vs. revision. We mutually agreed to proceed with revision, exam under anesthesia, and to remove the previous screw and do ORIF of the medial malleolar fragment. I discussed risks and benefits including nonunion, need for continued healing. He can put full weight on it in the Cam walker at this point in time. I discussed this case with Dr. Doran Durand. He suggested a  different type of hardware, a specialized foot plate, and we will contact the appropriate representative. We may use bone grafting as well.  Lacie Draft M. for Dr. Tonita Cong 02/01/2014, 9:25 PM

## 2014-02-07 ENCOUNTER — Other Ambulatory Visit (HOSPITAL_COMMUNITY): Payer: Self-pay | Admitting: Specialist

## 2014-02-07 ENCOUNTER — Encounter (HOSPITAL_COMMUNITY): Payer: Self-pay | Admitting: Pharmacy Technician

## 2014-02-07 NOTE — Patient Instructions (Signed)
Your procedure is scheduled on:  02/18/14  FRIDAY  Report to [facility] at  1100     AM.   Call this number if you have problems the morning of surgery: [phone]        Do not eat food  After MIDNIGHT Thursday NIGHT,  MAY HAVE CLEAR LIQUIDS UNTIL 07:00 AM Friday MORNING-- THEN NOTHING   Take these medicines the morning of surgery with A SIP OF WATER:NO REGULAR MEDICINES MAY TAKE OXYCODONE, METHOCARBAMOL IF NEEDED   .  Contacts, dentures or partial plates, or metal hairpins  can not be worn to surgery. Your family will be responsible for glasses, dentures, hearing aides while you are in surgery  Leave suitcase in the car. After surgery it may be brought to your room.  For patients admitted to the hospital, checkout time is 11:00 AM day of  discharge.         [facility] IS NOT RESPONSIBLE FOR ANY VALUABLES                                                                  [facility] - Preparing for Surgery Before surgery, you can play an important role.  Because skin is not sterile, your skin needs to be as free of germs as possible.  You can reduce the number of germs on your skin by washing with CHG (chlorahexidine gluconate) soap before surgery.  CHG is an antiseptic cleaner which kills germs and bonds with the skin to continue killing germs even after washing. Please DO NOT use if you have an allergy to CHG or antibacterial soaps.  If your skin becomes reddened/irritated stop using the CHG and inform your nurse when you arrive at Short Stay. Do not shave (including legs and underarms) for at least 48 hours prior to the first CHG shower.  You may shave your face/neck. Please follow these instructions carefully:  1.  Shower with CHG Soap the night before surgery and the  morning of Surgery.  2.  If you choose to wash your hair, wash your hair first as usual with your  normal  shampoo.  3.  After you  shampoo, rinse your hair and body thoroughly to remove the  shampoo.                           4.  Use CHG as you would any other liquid soap.  You can apply chg directly  to the skin and wash                       Gently with a scrungie or clean washcloth.  5.  Apply the CHG Soap to your body ONLY FROM THE NECK DOWN.   Do not use on face/ open                           Wound or open sores. Avoid contact with eyes, ears mouth and genitals (private parts).  Wash face,  Genitals (private parts) with your normal soap.             6.  Wash thoroughly, paying special attention to the area where your surgery  will be performed.  7.  Thoroughly rinse your body with warm water from the neck down.  8.  DO NOT shower/wash with your normal soap after using and rinsing off  the CHG Soap.                9.  Pat yourself dry with a clean towel.            10.  Wear clean pajamas.            11.  Place clean sheets on your bed the night of your first shower and do not  sleep with pets. Day of Surgery : Do not apply any lotions/deodorants the morning of surgery.  Please wear clean clothes to the hospital/surgery center.  FAILURE TO FOLLOW THESE INSTRUCTIONS MAY RESULT IN THE CANCELLATION OF YOUR SURGERY PATIENT SIGNATURE_________________________________  NURSE SIGNATURE__________________________________  ________________________________________________________________________    CLEAR LIQUID DIET   Foods Allowed                                                                     Foods Excluded  Coffee and tea, regular and decaf                             liquids that you cannot  Plain Jell-O in any flavor                                             see through such as: Fruit ices (not with fruit pulp)                                     milk, soups, orange juice  Iced Popsicles                                    All solid food Carbonated beverages, regular and diet                                     Cranberry, grape and apple juices Sports drinks like Gatorade Lightly seasoned clear broth or consume(fat free) Sugar, honey syrup  Sample Menu Breakfast                                Lunch                                     Supper Cranberry juice  Beef broth                            Chicken broth Jell-O                                     Grape juice                           Apple juice Coffee or tea                        Jell-O                                      Popsicle                                                Coffee or tea                        Coffee or tea  _____________________________________________________________________    Incentive Spirometer  An incentive spirometer is a tool that can help keep your lungs clear and active. This tool measures how well you are filling your lungs with each breath. Taking long deep breaths may help reverse or decrease the chance of developing breathing (pulmonary) problems (especially infection) following:  A long period of time when you are unable to move or be active. BEFORE THE PROCEDURE   If the spirometer includes an indicator to show your best effort, your nurse or respiratory therapist will set it to a desired goal.  If possible, sit up straight or lean slightly forward. Try not to slouch.  Hold the incentive spirometer in an upright position. INSTRUCTIONS FOR USE  1. Sit on the edge of your bed if possible, or sit up as far as you can in bed or on a chair. 2. Hold the incentive spirometer in an upright position. 3. Breathe out normally. 4. Place the mouthpiece in your mouth and seal your lips tightly around it. 5. Breathe in slowly and as deeply as possible, raising the piston or the ball toward the top of the column. 6. Hold your breath for 3-5 seconds or for as long as possible. Allow the piston or ball to fall to the bottom of the column. 7. Remove the mouthpiece from your  mouth and breathe out normally. 8. Rest for a few seconds and repeat Steps 1 through 7 at least 10 times every 1-2 hours when you are awake. Take your time and take a few normal breaths between deep breaths. 9. The spirometer may include an indicator to show your best effort. Use the indicator as a goal to work toward during each repetition. 10. After each set of 10 deep breaths, practice coughing to be sure your lungs are clear. If you have an incision (the cut made at the time of surgery), support your incision when coughing by placing a pillow or rolled up towels firmly against it. Once you are able to get out of bed, walk around indoors and cough well. You may stop using the incentive  spirometer when instructed by your caregiver.  RISKS AND COMPLICATIONS  Take your time so you do not get dizzy or light-headed.  If you are in pain, you may need to take or ask for pain medication before doing incentive spirometry. It is harder to take a deep breath if you are having pain. AFTER USE  Rest and breathe slowly and easily.  It can be helpful to keep track of a log of your progress. Your caregiver can provide you with a simple table to help with this. If you are using the spirometer at home, follow these instructions: [place] IF:   You are having difficultly using the spirometer.  You have trouble using the spirometer as often as instructed.  Your pain medication is not giving enough relief while using the spirometer.  You develop fever of 100.5 F (38.1 C) or higher. SEEK IMMEDIATE MEDICAL CARE IF:   You cough up bloody sputum that had not been present before.  You develop fever of 102 F (38.9 C) or greater.  You develop worsening pain at or near the incision site. MAKE SURE YOU:   Understand these instructions.  Will watch your condition.  Will get help right away if you are not doing well or get worse. Document Released: 10/28/2006 Document Revised: 09/09/2011  Document Reviewed: 12/29/2006 [facility] Patient Information 2014 [place], [place].   ________________________________________________________________________

## 2014-02-08 ENCOUNTER — Encounter (HOSPITAL_COMMUNITY)
Admission: RE | Admit: 2014-02-08 | Discharge: 2014-02-08 | Disposition: A | Discharge status: Home / Self Care | Payer: Medicare Other | Source: Ambulatory Visit | Attending: Specialist | Admitting: Specialist

## 2014-02-08 ENCOUNTER — Encounter (HOSPITAL_COMMUNITY): Payer: Self-pay

## 2014-02-08 DIAGNOSIS — Z01818 Encounter for other preprocedural examination: Secondary | ICD-10-CM | POA: Insufficient documentation

## 2014-02-08 DIAGNOSIS — Z01812 Encounter for preprocedural laboratory examination: Secondary | ICD-10-CM | POA: Insufficient documentation

## 2014-02-08 LAB — BASIC METABOLIC PANEL
Anion gap: 12 (ref 5–15)
BUN: 12 mg/dL (ref 6–23)
CO2: 26 meq/L (ref 19–32)
CREATININE: 1.03 mg/dL (ref 0.50–1.35)
Calcium: 10 mg/dL (ref 8.4–10.5)
Chloride: 101 mEq/L (ref 96–112)
GFR calc Af Amer: 86 mL/min — ABNORMAL LOW (ref >90)
GFR calc non Af Amer: 74 mL/min — ABNORMAL LOW (ref >90)
GLUCOSE: 100 mg/dL — AB (ref 70–99)
Potassium: 4.6 mEq/L (ref 3.7–5.3)
Sodium: 139 mEq/L (ref 137–147)

## 2014-02-08 LAB — CBC
HEMATOCRIT: 40.3 % (ref 39.0–52.0)
Hemoglobin: 14.1 g/dL (ref 13.0–17.0)
MCH: 30.3 pg (ref 26.0–34.0)
MCHC: 35 g/dL (ref 30.0–36.0)
MCV: 86.5 fL (ref 78.0–100.0)
PLATELETS: 239 10*3/uL (ref 150–400)
RBC: 4.66 MIL/uL (ref 4.22–5.81)
RDW: 14.1 % (ref 11.5–15.5)
WBC: 6.8 10*3/uL (ref 4.0–10.5)

## 2014-02-18 ENCOUNTER — Ambulatory Visit (HOSPITAL_COMMUNITY): Payer: Medicare Other

## 2014-02-18 ENCOUNTER — Encounter (HOSPITAL_COMMUNITY)
Admission: RE | Disposition: A | Discharge status: Home / Self Care | Payer: Self-pay | Source: Ambulatory Visit | Attending: Specialist

## 2014-02-18 ENCOUNTER — Ambulatory Visit (HOSPITAL_COMMUNITY)
Admission: RE | Admit: 2014-02-18 | Discharge: 2014-02-19 | Disposition: A | Discharge status: Home / Self Care | Payer: Medicare Other | Source: Ambulatory Visit | Attending: Specialist | Admitting: Specialist

## 2014-02-18 ENCOUNTER — Encounter (HOSPITAL_COMMUNITY): Payer: Medicare Other | Admitting: Anesthesiology

## 2014-02-18 ENCOUNTER — Ambulatory Visit (HOSPITAL_COMMUNITY): Payer: Medicare Other | Admitting: Anesthesiology

## 2014-02-18 ENCOUNTER — Encounter (HOSPITAL_COMMUNITY): Payer: Self-pay | Admitting: *Deleted

## 2014-02-18 DIAGNOSIS — S82899A Other fracture of unspecified lower leg, initial encounter for closed fracture: Secondary | ICD-10-CM | POA: Diagnosis present

## 2014-02-18 DIAGNOSIS — Z87891 Personal history of nicotine dependence: Secondary | ICD-10-CM | POA: Diagnosis not present

## 2014-02-18 DIAGNOSIS — M129 Arthropathy, unspecified: Secondary | ICD-10-CM | POA: Diagnosis not present

## 2014-02-18 DIAGNOSIS — S82891A Other fracture of right lower leg, initial encounter for closed fracture: Secondary | ICD-10-CM

## 2014-02-18 DIAGNOSIS — IMO0002 Reserved for concepts with insufficient information to code with codable children: Secondary | ICD-10-CM | POA: Diagnosis not present

## 2014-02-18 HISTORY — PX: ORIF ANKLE FRACTURE: SHX5408

## 2014-02-18 LAB — CREATININE, SERUM
Creatinine, Ser: 1.09 mg/dL (ref 0.50–1.35)
GFR calc Af Amer: 80 mL/min — ABNORMAL LOW (ref >90)
GFR calc non Af Amer: 69 mL/min — ABNORMAL LOW (ref >90)

## 2014-02-18 LAB — CBC
HCT: 40.3 % (ref 39.0–52.0)
Hemoglobin: 13.5 g/dL (ref 13.0–17.0)
MCH: 29.6 pg (ref 26.0–34.0)
MCHC: 33.5 g/dL (ref 30.0–36.0)
MCV: 88.4 fL (ref 78.0–100.0)
Platelets: 221 10*3/uL (ref 150–400)
RBC: 4.56 MIL/uL (ref 4.22–5.81)
RDW: 13.9 % (ref 11.5–15.5)
WBC: 7.6 10*3/uL (ref 4.0–10.5)

## 2014-02-18 SURGERY — OPEN REDUCTION INTERNAL FIXATION (ORIF) ANKLE FRACTURE
Anesthesia: General | Site: Ankle | Laterality: Right

## 2014-02-18 MED ORDER — PROPOFOL 10 MG/ML IV BOLUS
INTRAVENOUS | Status: DC | PRN
  Administered 2014-02-18: 200 mg via INTRAVENOUS

## 2014-02-18 MED ORDER — OXYCODONE-ACETAMINOPHEN 7.5-325 MG PO TABS: 1.0000 | ORAL_TABLET | ORAL | Status: DC | PRN

## 2014-02-18 MED ORDER — LIDOCAINE HCL (CARDIAC) 20 MG/ML IV SOLN
INTRAVENOUS | Status: AC
  Filled 2014-02-18: qty 5

## 2014-02-18 MED ORDER — KETAMINE HCL 10 MG/ML IJ SOLN
INTRAMUSCULAR | Status: AC
  Filled 2014-02-18: qty 1

## 2014-02-18 MED ORDER — ONDANSETRON HCL 4 MG/2ML IJ SOLN
INTRAMUSCULAR | Status: DC | PRN
  Administered 2014-02-18: 4 mg via INTRAVENOUS

## 2014-02-18 MED ORDER — EPHEDRINE SULFATE 50 MG/ML IJ SOLN
INTRAMUSCULAR | Status: AC
  Filled 2014-02-18: qty 1

## 2014-02-18 MED ORDER — METHOCARBAMOL 500 MG PO TABS: 500.0000 mg | ORAL_TABLET | Freq: Three times a day (TID) | ORAL | Status: DC | PRN

## 2014-02-18 MED ORDER — HYDROMORPHONE HCL PF 1 MG/ML IJ SOLN
INTRAMUSCULAR | Status: AC
  Filled 2014-02-18: qty 1

## 2014-02-18 MED ORDER — LACTATED RINGERS IV SOLN: INTRAVENOUS | Status: DC

## 2014-02-18 MED ORDER — HYDROMORPHONE HCL PF 1 MG/ML IJ SOLN
0.2500 mg | INTRAMUSCULAR | Status: DC | PRN
  Administered 2014-02-18 (×4): 0.5 mg via INTRAVENOUS

## 2014-02-18 MED ORDER — ONDANSETRON HCL 4 MG/2ML IJ SOLN: 4.0000 mg | Freq: Four times a day (QID) | INTRAMUSCULAR | Status: DC | PRN

## 2014-02-18 MED ORDER — EPHEDRINE SULFATE 50 MG/ML IJ SOLN
INTRAMUSCULAR | Status: DC | PRN
  Administered 2014-02-18 (×2): 10 mg via INTRAVENOUS

## 2014-02-18 MED ORDER — KETAMINE HCL 10 MG/ML IJ SOLN
INTRAMUSCULAR | Status: DC | PRN
  Administered 2014-02-18 (×3): 10 mg via INTRAVENOUS
  Administered 2014-02-18: 30 mg via INTRAVENOUS

## 2014-02-18 MED ORDER — HYDROMORPHONE HCL PF 1 MG/ML IJ SOLN
0.5000 mg | INTRAMUSCULAR | Status: DC | PRN
  Administered 2014-02-18 – 2014-02-19 (×2): 1 mg via INTRAVENOUS
  Filled 2014-02-18 (×2): qty 1

## 2014-02-18 MED ORDER — SODIUM CHLORIDE 0.9 % IJ SOLN
INTRAMUSCULAR | Status: AC
  Filled 2014-02-18: qty 10

## 2014-02-18 MED ORDER — SODIUM CHLORIDE 0.9 % IR SOLN
Freq: Once | Status: AC
  Administered 2014-02-18: 14:00:00
  Filled 2014-02-18: qty 1

## 2014-02-18 MED ORDER — CLINDAMYCIN PHOSPHATE 900 MG/50ML IV SOLN
900.0000 mg | INTRAVENOUS | Status: AC
  Administered 2014-02-18: 900 mg via INTRAVENOUS

## 2014-02-18 MED ORDER — DOCUSATE SODIUM 100 MG PO CAPS
100.0000 mg | ORAL_CAPSULE | Freq: Every day | ORAL | Status: DC
  Administered 2014-02-19: 100 mg via ORAL

## 2014-02-18 MED ORDER — MIDAZOLAM HCL 2 MG/2ML IJ SOLN
INTRAMUSCULAR | Status: AC
  Filled 2014-02-18: qty 2

## 2014-02-18 MED ORDER — MIDAZOLAM HCL 5 MG/5ML IJ SOLN
INTRAMUSCULAR | Status: DC | PRN
  Administered 2014-02-18: 2 mg via INTRAVENOUS

## 2014-02-18 MED ORDER — BUPIVACAINE-EPINEPHRINE (PF) 0.5% -1:200000 IJ SOLN
INTRAMUSCULAR | Status: DC | PRN
  Administered 2014-02-18: 3 mL

## 2014-02-18 MED ORDER — DOCUSATE SODIUM 100 MG PO CAPS: 100.0000 mg | ORAL_CAPSULE | Freq: Two times a day (BID) | ORAL | Status: DC | PRN

## 2014-02-18 MED ORDER — FENTANYL CITRATE 0.05 MG/ML IJ SOLN
INTRAMUSCULAR | Status: DC | PRN
  Administered 2014-02-18 (×2): 25 ug via INTRAVENOUS
  Administered 2014-02-18 (×4): 50 ug via INTRAVENOUS

## 2014-02-18 MED ORDER — ONDANSETRON HCL 4 MG/2ML IJ SOLN
INTRAMUSCULAR | Status: AC
  Filled 2014-02-18: qty 2

## 2014-02-18 MED ORDER — CEFAZOLIN SODIUM-DEXTROSE 2-3 GM-% IV SOLR
INTRAVENOUS | Status: AC
  Filled 2014-02-18: qty 50

## 2014-02-18 MED ORDER — CEFAZOLIN SODIUM-DEXTROSE 2-3 GM-% IV SOLR
2.0000 g | Freq: Four times a day (QID) | INTRAVENOUS | Status: AC
  Administered 2014-02-18 – 2014-02-19 (×3): 2 g via INTRAVENOUS
  Filled 2014-02-18 (×3): qty 50

## 2014-02-18 MED ORDER — PROMETHAZINE HCL 25 MG/ML IJ SOLN: 6.2500 mg | INTRAMUSCULAR | Status: DC | PRN

## 2014-02-18 MED ORDER — MEPERIDINE HCL 50 MG/ML IJ SOLN: 6.2500 mg | INTRAMUSCULAR | Status: DC | PRN

## 2014-02-18 MED ORDER — KETOROLAC TROMETHAMINE 15 MG/ML IJ SOLN
15.0000 mg | Freq: Four times a day (QID) | INTRAMUSCULAR | Status: DC
  Administered 2014-02-18 – 2014-02-19 (×3): 15 mg via INTRAVENOUS
  Filled 2014-02-18 (×7): qty 1

## 2014-02-18 MED ORDER — SODIUM CHLORIDE 0.9 % IR SOLN
Status: AC
  Filled 2014-02-18: qty 1

## 2014-02-18 MED ORDER — PROPOFOL 10 MG/ML IV BOLUS
INTRAVENOUS | Status: AC
  Filled 2014-02-18: qty 20

## 2014-02-18 MED ORDER — ENOXAPARIN SODIUM 40 MG/0.4ML ~~LOC~~ SOLN
40.0000 mg | SUBCUTANEOUS | Status: DC
  Filled 2014-02-18 (×2): qty 0.4

## 2014-02-18 MED ORDER — CLINDAMYCIN PHOSPHATE 900 MG/50ML IV SOLN
INTRAVENOUS | Status: AC
  Filled 2014-02-18: qty 50

## 2014-02-18 MED ORDER — LIDOCAINE HCL (CARDIAC) 20 MG/ML IV SOLN
INTRAVENOUS | Status: DC | PRN
  Administered 2014-02-18: 100 mg via INTRAVENOUS

## 2014-02-18 MED ORDER — METOCLOPRAMIDE HCL 10 MG PO TABS: 5.0000 mg | ORAL_TABLET | Freq: Three times a day (TID) | ORAL | Status: DC | PRN

## 2014-02-18 MED ORDER — ONDANSETRON HCL 4 MG PO TABS: 4.0000 mg | ORAL_TABLET | Freq: Four times a day (QID) | ORAL | Status: DC | PRN

## 2014-02-18 MED ORDER — DEXAMETHASONE SODIUM PHOSPHATE 10 MG/ML IJ SOLN
INTRAMUSCULAR | Status: DC | PRN
  Administered 2014-02-18: 10 mg via INTRAVENOUS

## 2014-02-18 MED ORDER — OXYCODONE-ACETAMINOPHEN 5-325 MG PO TABS
1.0000 | ORAL_TABLET | ORAL | Status: DC | PRN
  Administered 2014-02-18: 2 via ORAL
  Administered 2014-02-18: 1 via ORAL
  Administered 2014-02-19 (×2): 2 via ORAL
  Filled 2014-02-18: qty 1
  Filled 2014-02-18 (×3): qty 2

## 2014-02-18 MED ORDER — KCL IN DEXTROSE-NACL 20-5-0.45 MEQ/L-%-% IV SOLN
INTRAVENOUS | Status: DC
  Administered 2014-02-18: 18:00:00 via INTRAVENOUS
  Filled 2014-02-18 (×2): qty 1000

## 2014-02-18 MED ORDER — METOCLOPRAMIDE HCL 5 MG/ML IJ SOLN: 5.0000 mg | Freq: Three times a day (TID) | INTRAMUSCULAR | Status: DC | PRN

## 2014-02-18 MED ORDER — CEFAZOLIN SODIUM-DEXTROSE 2-3 GM-% IV SOLR
2.0000 g | Freq: Once | INTRAVENOUS | Status: AC
  Administered 2014-02-18: 2 g via INTRAVENOUS
  Filled 2014-02-18: qty 50

## 2014-02-18 MED ORDER — LACTATED RINGERS IV SOLN
INTRAVENOUS | Status: DC
  Administered 2014-02-18 (×2): via INTRAVENOUS

## 2014-02-18 MED ORDER — DEXAMETHASONE SODIUM PHOSPHATE 10 MG/ML IJ SOLN
INTRAMUSCULAR | Status: AC
  Filled 2014-02-18: qty 1

## 2014-02-18 MED ORDER — METHOCARBAMOL 500 MG PO TABS
500.0000 mg | ORAL_TABLET | Freq: Four times a day (QID) | ORAL | Status: DC | PRN
  Administered 2014-02-18 – 2014-02-19 (×2): 500 mg via ORAL
  Filled 2014-02-18 (×2): qty 1

## 2014-02-18 MED ORDER — FENTANYL CITRATE 0.05 MG/ML IJ SOLN
INTRAMUSCULAR | Status: AC
  Filled 2014-02-18: qty 5

## 2014-02-18 MED ORDER — BUPIVACAINE-EPINEPHRINE (PF) 0.5% -1:200000 IJ SOLN
INTRAMUSCULAR | Status: AC
Start: 2014-02-18 — End: 2014-02-18
  Filled 2014-02-18: qty 30

## 2014-02-18 MED ORDER — METHOCARBAMOL 1000 MG/10ML IJ SOLN
500.0000 mg | Freq: Four times a day (QID) | INTRAVENOUS | Status: DC | PRN
  Administered 2014-02-18: 500 mg via INTRAVENOUS
  Filled 2014-02-18: qty 5

## 2014-02-18 SURGICAL SUPPLY — 43 items
BAG ZIPLOCK 12X15 (MISCELLANEOUS) IMPLANT
CUFF TOURN SGL QUICK 34 (TOURNIQUET CUFF) ×2
CUFF TRNQT CYL 34X4X40X1 (TOURNIQUET CUFF) ×2 IMPLANT
DRAPE C-ARM 42X120 X-RAY (DRAPES) ×4 IMPLANT
DRAPE C-ARMOR (DRAPES) ×4 IMPLANT
DRAPE POUCH INSTRU U-SHP 10X18 (DRAPES) ×4 IMPLANT
DRAPE U-SHAPE 47X51 STRL (DRAPES) ×4 IMPLANT
DRILL 2.30MM (DRILL) ×4 IMPLANT
DRILL BIT CAN 2.0 LONG (BIT) ×4 IMPLANT
DURAPREP 26ML APPLICATOR (WOUND CARE) ×4 IMPLANT
ELECT REM PT RETURN 9FT ADLT (ELECTROSURGICAL) ×4
ELECTRODE REM PT RTRN 9FT ADLT (ELECTROSURGICAL) ×2 IMPLANT
GAUZE SPONGE 4X4 12PLY STRL (GAUZE/BANDAGES/DRESSINGS) ×4 IMPLANT
GLOVE BIO SURGEON STRL SZ7.5 (GLOVE) ×4 IMPLANT
GLOVE BIOGEL M 7.0 STRL (GLOVE) ×4 IMPLANT
GLOVE BIOGEL PI IND STRL 7.5 (GLOVE) ×4 IMPLANT
GLOVE BIOGEL PI IND STRL 8 (GLOVE) ×2 IMPLANT
GLOVE BIOGEL PI INDICATOR 7.5 (GLOVE) ×4
GLOVE BIOGEL PI INDICATOR 8 (GLOVE) ×2
GLOVE SURG SS PI 8.0 STRL IVOR (GLOVE) ×4 IMPLANT
GOWN STRL REUS W/TWL XL LVL3 (GOWN DISPOSABLE) ×12 IMPLANT
K-WIRE 0.9  100L (WIRE) ×8 IMPLANT
KIT BASIN OR (CUSTOM PROCEDURE TRAY) ×4 IMPLANT
KWIRE 1.1 (Wire) ×4 IMPLANT
MANIFOLD NEPTUNE II (INSTRUMENTS) ×4 IMPLANT
NEEDLE HYPO 22GX1.5 SAFETY (NEEDLE) ×4 IMPLANT
PACK LOWER EXTREMITY WL (CUSTOM PROCEDURE TRAY) ×4 IMPLANT
POSITIONER SURGICAL ARM (MISCELLANEOUS) ×4 IMPLANT
PUTTY DBM STAGRAFT PLUS 5CC (Putty) ×4 IMPLANT
SCREW CANCELLOUR 3.8 26 ANKL (Screw) ×8 IMPLANT
SLED MALLEOLAR 36MM (Orthopedic Implant) ×4 IMPLANT
SPONGE LAP 4X18 X RAY DECT (DISPOSABLE) ×4 IMPLANT
STAPLER VISISTAT 35W (STAPLE) IMPLANT
SUCTION FRAZIER 12FR DISP (SUCTIONS) ×4 IMPLANT
SUT VIC AB 1 CT1 27 (SUTURE) ×4
SUT VIC AB 1 CT1 27XBRD ANTBC (SUTURE) ×4 IMPLANT
SUT VIC AB 2-0 CT1 27 (SUTURE) ×4
SUT VIC AB 2-0 CT1 TAPERPNT 27 (SUTURE) ×4 IMPLANT
SYR CONTROL 10ML LL (SYRINGE) ×4 IMPLANT
TOWEL OR 17X26 10 PK STRL BLUE (TOWEL DISPOSABLE) ×4 IMPLANT
TOWEL OR NON WOVEN STRL DISP B (DISPOSABLE) ×4 IMPLANT
WASHER MALLEOLAR MEDIAL (Washer) ×4 IMPLANT
WIRE K 1.6X100 WIRE1.6100 (MISCELLANEOUS) ×4 IMPLANT

## 2014-02-18 NOTE — Brief Op Note (Signed)
02/18/2014  3:52 PM  PATIENT:  Derek Browning  63 y.o. male  PRE-OPERATIVE DIAGNOSIS:  NON UNION RIGHT ANKLE FRACTURE   POST-OPERATIVE DIAGNOSIS:  NON UNION RIGHT ANKLE FRACTURE   PROCEDURE:  Procedure(s): SCREW REMOVAL, REVISION OPEN REDUCTION INTERNAL FIXATION RIGHT  ANKLE (Right)  SURGEON:  Surgeon(s) and Role:    * [name], MD - Primary  PHYSICIAN ASSISTANT:   ASSISTANTS:   ANESTHESIA:   general  EBL:  Total I/O In: 1000 [I.V.:1000] Out: -   BLOOD ADMINISTERED:none  DRAINS: none   LOCAL MEDICATIONS USED:  MARCAINE     SPECIMEN:  Biopsy / Limited Resection  DISPOSITION OF SPECIMEN:  N/A  COUNTS:  YES  TOURNIQUET:    DICTATION: .Other Dictation: Dictation Number [id number]  PLAN OF CARE: Admit for overnight observation  PATIENT DISPOSITION:  PACU - hemodynamically stable.   Delay start of Pharmacological VTE agent (>24hrs) due to surgical blood loss or risk of bleeding: no

## 2014-02-18 NOTE — Transfer of Care (Signed)
Immediate Anesthesia Transfer of Care Note  Patient: Derek Browning  Procedure(s) Performed: Procedure(s): SCREW REMOVAL, REVISION OPEN REDUCTION INTERNAL FIXATION RIGHT  ANKLE (Right)  Patient Location: PACU  Anesthesia Type:General  Level of Consciousness: awake, alert , oriented and patient cooperative  Airway & Oxygen Therapy: Patient Spontanous Breathing and Patient connected to face mask oxygen  Post-op Assessment: Report given to PACU RN and Post -op Vital signs reviewed and stable  Post vital signs: Reviewed and stable  Complications: No apparent anesthesia complications

## 2014-02-18 NOTE — Discharge Instructions (Signed)
Toe touch weight bearing right ankle Take aspirin 325mg  per day with a meal.

## 2014-02-18 NOTE — Interval H&P Note (Signed)
History and Physical Interval Note:  02/18/2014 1:16 PM  Derek Browning  has presented today for surgery, with the diagnosis of NON UNION RIGHT ANKLE FRACTURE   The various methods of treatment have been discussed with the patient and family. After consideration of risks, benefits and other options for treatment, the patient has consented to  Procedure(s): REVISION OPEN REDUCTION INTERNAL FIXATION (ORIF) RIGHT  ANKLE POSSIBLE AUTOGRAFT,  ALLOGRAFT AND  BONE GRAFT  (Right) as a surgical intervention .  The patient's history has been reviewed, patient examined, no change in status, stable for surgery.  I have reviewed the patient's chart and labs.  Questions were answered to the patient's satisfaction.     [name] C

## 2014-02-18 NOTE — H&P (View-Only) (Signed)
Derek Browning is an 63 y.o. male.   Chief Complaint: right ankle pain  HPI: The patient is a 63 year old male presenting for a post-operative visit. Patient is 8 weeks, 6 days postop following their ORIF right bimalleolar ankle fracture.Overall the patient feels that they are doing fair. The patient does report pain and swelling Pain medications include: Percocet . The patient feels they are having no difficulty tolerating their pain medication(s).The patient states that they feel that their pain is well controlled. The patient is currently non-weightbearing with the assistance of: walker (kneeling walker). The patient is using a walking boot. He had a fall with increased pain and swelling about 4 weeks ago now. He follows up. He's had no changes in his pain medially, in the medial soft tissues.  Past Medical History  Diagnosis Date  . Arthritis     Past Surgical History  Procedure Laterality Date  . Appendectomy    . C5 disc surgery   1989  . Orif ankle fracture Right 11/24/2013    Procedure: OPEN REDUCTION INTERNAL FIXATION (ORIF) RIGHT ANKLE;  Surgeon: [name], MD;  Location: [facility];  Service: Orthopedics;  Laterality: Right;    No family history on file. Social History:  reports that he quit smoking about 15 years ago. He has never used smokeless tobacco. He reports that he drinks about 3 ounces of alcohol per week. He reports that he does not use illicit drugs.  Allergies:  Allergies  Allergen Reactions  . Cefepime Rash    No swelling or difficulty breathing     (Not in a hospital admission)  No results found for this or any previous visit (from the past 48 hour(s)). No results found.  Review of Systems  Constitutional: Negative.   HENT: Negative.   Eyes: Negative.   Respiratory: Negative.   Cardiovascular: Negative.   Gastrointestinal: Negative.   Genitourinary: Negative.   Musculoskeletal: Positive for joint pain.  Skin: Negative.   Neurological:  Negative.   Psychiatric/Behavioral: Negative.     There were no vitals taken for this visit. Physical Exam  Constitutional: He is oriented to person, place, and time. He appears well-developed and well-nourished.  HENT:  Head: Normocephalic and atraumatic.  Eyes: Conjunctivae and EOM are normal. Pupils are equal, round, and reactive to light.  Neck: Normal range of motion. Neck supple.  Cardiovascular: Normal rate and regular rhythm.   Respiratory: Effort normal and breath sounds normal.  GI: Soft. Bowel sounds are normal.  Musculoskeletal:  On exam, he has a prominence medially. He is tender over the medial malleolus. He has no evidence of infection there.  Neurological: He is alert and oriented to person, place, and time. He has normal reflexes.  Skin: Skin is warm and dry.  Psychiatric: He has a normal mood and affect.    Imaging I repeated the x-rays. There is no further callus over the medial malleolar avulsions. The fibula appears to be healed.  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 Displacement of the medial malleolar fracture.  I discussed attempted observation and living with it vs. revision. We mutually agreed to proceed with revision, exam under anesthesia, and to remove the previous screw and do ORIF of the medial malleolar fragment. I discussed risks and benefits including nonunion, need for continued healing. He can put full weight on it in the Cam walker at this point in time. I discussed this case with Dr. Doran Durand. He suggested a  different type of hardware, a specialized foot plate, and we will contact the appropriate representative. We may use bone grafting as well.  Lacie Draft M. for Dr. Tonita Cong 02/01/2014, 9:25 PM

## 2014-02-18 NOTE — Anesthesia Postprocedure Evaluation (Signed)
  Anesthesia Post-op Note  Patient: Derek Browning  Procedure(s) Performed: Procedure(s): SCREW REMOVAL, REVISION OPEN REDUCTION INTERNAL FIXATION RIGHT  ANKLE (Right)  Patient Location: PACU  Anesthesia Type:General  Level of Consciousness: awake, alert  and oriented  Airway and Oxygen Therapy: Patient Spontanous Breathing  Post-op Pain: mild  Post-op Assessment: Post-op Vital signs reviewed  Post-op Vital Signs: Reviewed  Last Vitals:  Filed Vitals:   02/18/14 1735  BP: 148/75  Pulse: 82  Temp: 36.7 C  Resp: 16    Complications: No apparent anesthesia complications

## 2014-02-18 NOTE — Anesthesia Preprocedure Evaluation (Signed)
Anesthesia Evaluation  Patient identified by MRN, date of birth, ID band Patient awake    Reviewed: Allergy & Precautions, H&P , NPO status , Patient's Chart, lab work & pertinent test results  Airway Mallampati: II TM Distance: >3 FB Neck ROM: full    Dental  (+) Edentulous Upper, Edentulous Lower, Dental Advisory Given   Pulmonary neg pulmonary ROS, former smoker,  breath sounds clear to auscultation  Pulmonary exam normal       Cardiovascular Exercise Tolerance: Good negative cardio ROS  Rhythm:regular Rate:Normal     Neuro/Psych negative neurological ROS  negative psych ROS   GI/Hepatic negative GI ROS, Neg liver ROS,   Endo/Other  negative endocrine ROS  Renal/GU negative Renal ROS  negative genitourinary   Musculoskeletal   Abdominal   Peds  Hematology negative hematology ROS (+)   Anesthesia Other Findings   Reproductive/Obstetrics negative OB ROS                           Anesthesia Physical  Anesthesia Plan  ASA: I  Anesthesia Plan: General   Post-op Pain Management:    Induction: Intravenous  Airway Management Planned: LMA  Additional Equipment:   Intra-op Plan:   Post-operative Plan:   Informed Consent: I have reviewed the patients History and Physical, chart, labs and discussed the procedure including the risks, benefits and alternatives for the proposed anesthesia with the patient or authorized representative who has indicated his/her understanding and acceptance.   Dental Advisory Given  Plan Discussed with: CRNA and Surgeon  Anesthesia Plan Comments:         Anesthesia Quick Evaluation

## 2014-02-19 NOTE — Discharge Summary (Signed)
Physician Discharge Summary   Patient ID: Derek Browning MRN: [id number] DOB/AGE: [birth year] 65 y.o.  Admit date: 03/06/2014 Discharge date: 02/19/2014  Admission Diagnoses:  Active Problems:   Ankle fracture   Discharge Diagnoses:  Same   Surgeries: Procedure(s): SCREW REMOVAL, REVISION OPEN REDUCTION INTERNAL FIXATION RIGHT  ANKLE on 03-06-2014   Consultants: none  Discharged Condition: Stable  Hospital Course: Derek Browning is an 63 y.o. male who was admitted 03/06/14 with a chief complaint of No chief complaint on file. , and found to have a diagnosis of <principal problem not specified>.  They were brought to the operating room on March 06, 2014 and underwent the above named procedures.    The patient had an uncomplicated hospital course and was stable for discharge.  Recent vital signs:  Filed Vitals:   02/19/14 0413  BP: 117/63  Pulse: 82  Temp: 98.2 F (36.8 C)  Resp: 16    Recent laboratory studies:  Results for orders placed during the hospital encounter of 03-06-2014  CBC      Result Value Ref Range   WBC 7.6  4.0 - 10.5 K/uL   RBC 4.56  4.22 - 5.81 MIL/uL   Hemoglobin 13.5  13.0 - 17.0 g/dL   HCT 40.3  39.0 - 52.0 %   MCV 88.4  78.0 - 100.0 fL   MCH 29.6  26.0 - 34.0 pg   MCHC 33.5  30.0 - 36.0 g/dL   RDW 13.9  11.5 - 15.5 %   Platelets 221  150 - 400 K/uL  CREATININE, SERUM      Result Value Ref Range   Creatinine, Ser 1.09  0.50 - 1.35 mg/dL   GFR calc non Af Amer 69 (*) >90 mL/min   GFR calc Af Amer 80 (*) >90 mL/min    Discharge Medications:     Medication List    ASK your doctor about these medications       aspirin EC 81 MG tablet  Take 81 mg by mouth every morning.     docusate sodium 100 MG capsule  Commonly known as:  COLACE  Take 100 mg by mouth daily with lunch.  Ask about: Which instructions should I use?     docusate sodium 100 MG capsule  Commonly known as:  COLACE  Take 1 capsule (100 mg total) by mouth 2 (two) times daily as  needed for mild constipation.  Ask about: Which instructions should I use?     methocarbamol 500 MG tablet  Commonly known as:  ROBAXIN  Take 1 tablet (500 mg total) by mouth every 6 (six) hours as needed for muscle spasms.  Ask about: Which instructions should I use?     methocarbamol 500 MG tablet  Commonly known as:  ROBAXIN  Take 1 tablet (500 mg total) by mouth 3 (three) times daily between meals as needed for muscle spasms.  Ask about: Which instructions should I use?     oxyCODONE-acetaminophen 5-325 MG per tablet  Commonly known as:  PERCOCET/ROXICET  Take 1 tablet by mouth every 4 (four) hours as needed for severe pain.  Ask about: Which instructions should I use?     oxyCODONE-acetaminophen 7.5-325 MG per tablet  Commonly known as:  PERCOCET  Take 1 tablet by mouth every 4 (four) hours as needed for pain.  Ask about: Which instructions should I use?        Diagnostic Studies: Dg Ankle 2 Views Right  03-06-14   CLINICAL DATA:  Revision of  fracture fixation.  EXAM: RIGHT ANKLE - 2 VIEW  COMPARISON:  Plain films of the right ankle 12/08/2013.  FINDINGS: Single screw in the medial malleolus has been removed with placement of new fixation hardware. Position and alignment are improved. No new abnormality is identified. Plate and screws and interfragmentary screw fixing a distal fibular fracture are again seen.  IMPRESSION: Revision of fixation of a medial malleolar fracture without evidence of complication.   Electronically Signed   By: Inge Rise M.D.   On: 02/18/2014 16:13   Dg C-arm Gt 120 Min-no Report  02/18/2014   CLINICAL DATA: revision orif right ankle   C-ARM GT 120 MINUTE  Fluoroscopy was utilized by the requesting physician.  No radiographic  interpretation.     Disposition: 01-Home or Self Care        Follow-up Information   Follow up with BEANE,JEFFREY C, MD In 2 weeks.   Specialty:  Orthopedic Surgery   Contact information:   [address] [place] [address] [phone]        Signed: [name] 02/19/2014, 7:12 AM

## 2014-02-19 NOTE — Progress Notes (Signed)
   Subjective: 1 Day Post-Op Procedure(s) (LRB): SCREW REMOVAL, REVISION OPEN REDUCTION INTERNAL FIXATION RIGHT  ANKLE (Right)  Pt doing well Ready for d/c home Patient reports pain as mild.  Objective:   VITALS:   Filed Vitals:   02/19/14 0413  BP: 117/63  Pulse: 82  Temp: 98.2 F (36.8 C)  Resp: 16    Right ankle nv intact distally Currently in cam boot with good movement  LABS  Recent Labs  02/18/14 1800  HGB 13.5  HCT 40.3  WBC 7.6  PLT 221     Recent Labs  02/18/14 1800  CREATININE 1.09     Assessment/Plan: 1 Day Post-Op Procedure(s) (LRB): SCREW REMOVAL, REVISION OPEN REDUCTION INTERNAL FIXATION RIGHT  ANKLE (Right) Plan to d/c home later today F/u with Dr. Tonita Cong in 1-2 weeks   Merla Riches, MPAS, PA-C  02/19/2014, 7:11 AM

## 2014-02-19 NOTE — Op Note (Signed)
Derek Browning, GRANDT NO.:  [id number]  MEDICAL RECORD NO.:  [id number]  LOCATION:  68                         FACILITY:  [facility]  PHYSICIAN:  Susa Day, M.D.    DATE OF BIRTH:  1951/04/26  DATE OF PROCEDURE:  02/18/2014 DATE OF DISCHARGE:                              OPERATIVE REPORT   PREOPERATIVE DIAGNOSIS:  Nonunion of the medial malleolus, right ankle.  POSTOPERATIVE DIAGNOSIS:  Nonunion of the medial malleolus, right ankle.  PROCEDURE PERFORMED: 1. Takedown of nonunion medial malleolus, right ankle. 2. Open reduction and internal fixation of the medial malleolar     fragment utilizing a TriMed paper clip plate. 3. Autologous, allograft, bone graft utilizing Biomet DBM plus.  ANESTHESIA:  General.  ASSISTANT:  Nehemiah Massed, PA-C.  HISTORY:  This is a 63 year old status post ORIF of an ankle fracture. He felt postoperatively displaced medial malleolus, it went on to nonunion, it was painful, was indicated for revision, takedown of the nonunion, and ORIF.  Risks and benefits were discussed including bleeding, infection, damage to neurovascular structures, DVT, PE, anesthetic complications, continued nonunion, persistent pain, etc.  TECHNIQUE:  With the patient in supine position, after induction of adequate general anesthesia, 2 g of Kefzol, the right lower extremity was prepped and draped in usual sterile fashion.  Exam under anesthesia revealed a stable syndesmosis.  A good dorsiflexion and plantar flexion, some mobility noted over the medial malleolus.  Incision was made over the medial malleolar region in the previous scar and extended proximally.  We dissected down through the subcutaneous tissue. Electrocautery was utilized to achieve hemostasis.  0.5% Marcaine was infiltrated in the subcutaneous tissue.  I incised the periosteum and the deltoid ligament.  Under x-ray of AP and lateral plain, we localized our fragment distally.  There was a  very small portion of the fragment at his distal tip.  We identified the nonunion, curetted it, and curetted with a dental pick using C-arm to localize it.  We then made a small arthrotomy and placed a Freer into the joint reducing the small fragment distally as best possible.  Then through multiple configurations, we were able to place a small K-wire into the fragment up into the tibia and then reduced it with a tenaculum.  We tried multiple configurations for the TriMed plate, we found that the paper clips lead was the only one which was of low profile that they could accept that.  We then used a template for that paper clip and we placed it over the medial malleolus, drilled 2 separate K-wires anterior and posterior to the central K-wire.  We overdrilled it with that.  We did the drillings with bone graft.  Following this, we tried multiple plates, but the small paper clip plate was best.  We then advanced that over the K-wires that were drilled and impacted it into place.  The paper clip hooks caught the portion of the proximal part of the distal fragment.  It was too smaller than area distally with comminution to be able to capture that.  This impacted it to the bone.  We had prior to that placed bone graft locally from using the  Beyer rongeur and from our drilling into the nonunion site that was curetted and then impacted that.  We had a further small defect there for which we used DBM plus bone graft after seating of the sled.  I then took the K-wire that was in the fragment into the most distal portion of the fragment that we would capture, cut it, and bent it forward and impacted it into the medial malleolus.  I then took the [facility] bone graft after copious irrigation of the wound and packed it around the sled and into the small areas of the fracture site, where there was missing bone.  This did not enter the joint and this was done meticulously under C-arm augmentation in the AP  and lateral plane.  We then fixed the proximal part of the paper clip plate with a washer and 2 cancellous screws measuring 26 mm in length at the appropriate seating of the washer, drilling and insertion with cancellous screws that produced excellent purchase and placed the proximal portion of the plate against the tibia.  AP and lateral plane was satisfactory.  In the AP with good restoration of the mortise, good dorsiflexion and plantar flexion.  We were observing and we had skeletonized the distal fragment, we were able to capture the fragment in the best position possible, it was stable.  I then copiously irrigated the wound and closed the periosteum with 1 Vicryl, subcu with 2 and subcuticular Monocryl.  Sterile dressing was applied.  We did not use the tourniquet.  The AP and lateral plane satisfactory and stable. Syndesmosis was stable as well under x-ray.  After sterile dressing was applied, he was placed in a CAM walker, extubated without difficulty, and transported to the recovery room in a satisfactory condition.  The patient tolerated the procedure well.  No complications.  Assistant Nehemiah Massed, PA-C who was used to hold the extremity throughout the case, holding with reduction was absolutely necessary.  Blood loss minimal.     Susa Day, M.D.     JB/MEDQ  D:  02/18/2014  T:  02/18/2014  Job:  [id number]

## 2014-02-19 NOTE — Progress Notes (Signed)
CARE MANAGEMENT NOTE 02/19/2014  Patient:  Derek Browning, Derek Browning   Account Number:  [id number]  Date Initiated:  02/19/2014  Documentation initiated by:  [facility]  Subjective/Objective Assessment:   SCREW REMOVAL, REVISION OPEN REDUCTION INTERNAL FIXATION RIGHT  ANKLE     Action/Plan:   Anticipated DC Date:  02/19/2014   Anticipated DC Plan:  [place]  CM consult      Choice offered to / List presented to:          [facility] arranged  HH-2 PT      [place]   Status of service:  Completed, signed off Medicare Important Message given?  NA - LOS <3 / Initial given by admissions (If response is "NO", the following Medicare IM given date fields will be blank) Date Medicare IM given:   Medicare IM given by:   Date Additional Medicare IM given:   Additional Medicare IM given by:    Discharge Disposition:  [place]  Per UR Regulation:    If discussed at Long Length of Stay Meetings, dates discussed:    Comments:  [facility] aware of scheduled dc home today with [facility]. Jonnie Finner RN CCM Case Mgmt phone [phone]

## 2014-02-19 NOTE — Progress Notes (Signed)
PT Cancellation Note  Patient Details Name: Derek Browning MRN: [id number] DOB: [birth year]   Cancelled Treatment:    Reason Eval/Treat Not Completed: PT screened, no needs identified, will sign offpatient reports that he does not need PT.   Claretha Cooper 02/19/2014, 10:54 AM

## 2014-02-20 NOTE — Progress Notes (Signed)
CARE MANAGEMENT NOTE 02/20/2014  Patient:  Derek Browning, Derek Browning   Account Number:  [id number]  Date Initiated:  02/19/2014  Documentation initiated by:  [facility]  Subjective/Objective Assessment:   SCREW REMOVAL, REVISION OPEN REDUCTION INTERNAL FIXATION RIGHT  ANKLE     Action/Plan:   HH preoperatively arranged with [facility]   Anticipated DC Date:  02/19/2014   Anticipated DC Plan:  [place]  CM consult      [facility] Choice  HOME HEALTH   Choice offered to / List presented to:  NA        [place] arranged  HH-2 PT      [place]   Status of service:  Completed, signed off Medicare Important Message given?  NA - LOS <3 / Initial given by admissions (If response is "NO", the following Medicare IM given date fields will be blank) Date Medicare IM given:   Medicare IM given by:   Date Additional Medicare IM given:   Additional Medicare IM given by:    Discharge Disposition:  [place]  Per UR Regulation:    If discussed at Long Length of Stay Meetings, dates discussed:    Comments:  02/20/2014 1400 Faxed order to [place] for [facility]. [place] agency will follow up with office for F2F.  Jonnie Finner RN CCM Case Mgmt phone [phone]  [facility] aware of scheduled dc home today with [facility]. Jonnie Finner RN CCM Case Mgmt phone [phone]

## 2014-02-23 ENCOUNTER — Encounter (HOSPITAL_COMMUNITY): Payer: Self-pay | Admitting: Specialist

## 2014-07-20 DIAGNOSIS — S82841D Displaced bimalleolar fracture of right lower leg, subsequent encounter for closed fracture with routine healing: Secondary | ICD-10-CM | POA: Diagnosis not present

## 2014-07-26 ENCOUNTER — Ambulatory Visit: Payer: Self-pay | Admitting: Orthopedic Surgery

## 2014-08-26 ENCOUNTER — Ambulatory Visit: Payer: Self-pay | Admitting: Orthopedic Surgery

## 2014-08-26 NOTE — H&P (Signed)
Derek Browning is an 64 y.o. male.   Chief Complaint: R ankle pain, slow healing wound HPI: The patient is a 64 year old male who presents for a follow-up for Post op. 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 Post operative pain has been mild (2/10). The patient does report pain (worse at night), swelling and redness, while the patient does not report drainage Pain medications include: Percocet . The patient is on the following DVT prophylaxis treatment: Aspirin . The patient is currently 100 percent weightbearing without the use of assistive devices. The patient is not using a brace.   Past Medical History  Diagnosis Date  . Arthritis     Past Surgical History  Procedure Laterality Date  . Appendectomy    . C5 disc surgery   1989  . Orif ankle fracture Right 11/24/2013    Procedure: OPEN REDUCTION INTERNAL FIXATION (ORIF) RIGHT ANKLE;  Surgeon: Derek Hai, MD;  Location: [facility];  Service: Orthopedics;  Laterality: Right;  . Orif ankle fracture Right 02/18/2014    Procedure: SCREW REMOVAL, REVISION OPEN REDUCTION INTERNAL FIXATION RIGHT  ANKLE;  Surgeon: Derek Hai, MD;  Location: [facility];  Service: Orthopedics;  Laterality: Right;    No family history on file. Social History:  reports that he quit smoking about 16 years ago. He has never used smokeless tobacco. He reports that he drinks about 3.0 oz of alcohol per week. He reports that he does not use illicit drugs.  Allergies:  Allergies  Allergen Reactions  . Cefepime Rash    No swelling or difficulty breathing     (Not in a hospital admission)  No results found for this or any previous visit (from the past 48 hour(s)). No results found.  Review of Systems  Constitutional: Negative.   HENT: Negative.   Eyes: Negative.   Respiratory: Negative.   Cardiovascular: Negative.    Gastrointestinal: Negative.   Genitourinary: Negative.   Musculoskeletal: Positive for joint pain.  Skin: Negative.   Neurological: Negative.   Psychiatric/Behavioral: Negative.     There were no vitals taken for this visit. Physical Exam  Constitutional: He is oriented to person, place, and time. He appears well-developed and well-nourished.  HENT:  Head: Normocephalic and atraumatic.  Eyes: Conjunctivae and EOM are normal. Pupils are equal, round, and reactive to light.  Neck: Normal range of motion. Neck supple.  Cardiovascular: Normal rate and regular rhythm.   Respiratory: Effort normal and breath sounds normal.  GI: Soft. Bowel sounds are normal.  Musculoskeletal:  On exam medial malleolus small area with an eschar at the center of it with prominence where the hardware was.  Neurological: He is alert and oriented to person, place, and time. He has normal reflexes.  Skin: Skin is warm and dry.  Psychiatric: He has a normal mood and affect.    X rays demonstrate apparent healed fracture. I do not see the cavity extending down to the hardware. There is no fluctuance of infection. He has good subtalar and tibiotalar motion.  Assessment/Plan Retained hardware R ankle  Five months status post ORIF redo of the ankle with probable hardware prominence and intermittent wound eschar. No evidence of infection.  We will continue protection. We will schedule him for revision and hardware removal. Discussed risks and benefits including partial removal of the hardware, persistent symptoms, refracture,  postoperative course. He has a prescription for Doxycycline, local wound care, protective measurements. He wants to go to [place]. If he has any changes in the interim he is to call. He got a prescription for Doxycycline and Percocet.  Plan Removal of retained hardware R ankle   Derek Browning. PA-C for Dr. Tonita Cong 08/26/2014, 1:32 PM

## 2014-09-13 ENCOUNTER — Encounter (HOSPITAL_COMMUNITY)
Admission: RE | Admit: 2014-09-13 | Discharge: 2014-09-13 | Disposition: A | Discharge status: Home / Self Care | Payer: Medicare Other | Source: Ambulatory Visit | Attending: Specialist | Admitting: Specialist

## 2014-09-13 ENCOUNTER — Encounter (HOSPITAL_COMMUNITY): Payer: Self-pay

## 2014-09-13 DIAGNOSIS — Z7982 Long term (current) use of aspirin: Secondary | ICD-10-CM | POA: Diagnosis not present

## 2014-09-13 DIAGNOSIS — T8484XA Pain due to internal orthopedic prosthetic devices, implants and grafts, initial encounter: Secondary | ICD-10-CM | POA: Diagnosis not present

## 2014-09-13 DIAGNOSIS — Y658 Other specified misadventures during surgical and medical care: Secondary | ICD-10-CM | POA: Diagnosis not present

## 2014-09-13 LAB — BASIC METABOLIC PANEL
Anion gap: 6 (ref 5–15)
BUN: 12 mg/dL (ref 6–23)
CHLORIDE: 106 mmol/L (ref 96–112)
CO2: 26 mmol/L (ref 19–32)
Calcium: 9.7 mg/dL (ref 8.4–10.5)
Creatinine, Ser: 1.16 mg/dL (ref 0.50–1.35)
GFR calc Af Amer: 75 mL/min — ABNORMAL LOW (ref >90)
GFR calc non Af Amer: 64 mL/min — ABNORMAL LOW (ref >90)
GLUCOSE: 112 mg/dL — AB (ref 70–99)
POTASSIUM: 4.5 mmol/L (ref 3.5–5.1)
SODIUM: 138 mmol/L (ref 135–145)

## 2014-09-13 LAB — CBC
HEMATOCRIT: 45.8 % (ref 39.0–52.0)
Hemoglobin: 15.7 g/dL (ref 13.0–17.0)
MCH: 30.8 pg (ref 26.0–34.0)
MCHC: 34.3 g/dL (ref 30.0–36.0)
MCV: 89.8 fL (ref 78.0–100.0)
Platelets: 225 10*3/uL (ref 150–400)
RBC: 5.1 MIL/uL (ref 4.22–5.81)
RDW: 13 % (ref 11.5–15.5)
WBC: 5 10*3/uL (ref 4.0–10.5)

## 2014-09-13 NOTE — Patient Instructions (Signed)
[place]  09/13/2014   Your procedure is scheduled on:    Thursday, September 15, 2014   Report to [facility] Main Entrance and follow signs to  [facility] arrive at 5:00 AM.   Call this number if you have problems the morning of surgery [phone] or Presurgical Testing [phone].   Remember:  Do not eat food or drink liquids :After Midnight.  For Living Will and/or Health Care Power Attorney Forms: please provide copy for your medical record, may bring AM of surgery (forms should be already notarized-we do not provide this service).     Take these medicines the morning of surgery with A SIP OF WATER: NONE                               You may not have any metal on your body including hair pins and piercings  Do not wear jewelry, colognes, lotions, powders, or deodorant.  Men may shave face and neck.               Do not bring valuables to the hospital. [place].  Contacts, dentures or bridgework may not be worn into surgery.   Patients discharged the day of surgery will not be allowed to drive home.  Name and phone number of your driver:Paula Brouillard (wife)   ________________________________________________________________________  [facility] - Preparing for Surgery Before surgery, you can play an important role.  Because skin is not sterile, your skin needs to be as free of germs as possible.  You can reduce the number of germs on your skin by washing with CHG (chlorahexidine gluconate) soap before surgery.  CHG is an antiseptic cleaner which kills germs and bonds with the skin to continue killing germs even after washing. Please DO NOT use if you have an allergy to CHG or antibacterial soaps.  If your skin becomes reddened/irritated stop using the CHG and inform your nurse when you arrive at Short Stay. Do not shave (including legs and underarms) for at least 48 hours prior to the first CHG shower.  You may shave your  face/neck. Please follow these instructions carefully:  1.  Shower with CHG Soap the night before surgery and the  morning of Surgery.  2.  If you choose to wash your hair, wash your hair first as usual with your  normal  shampoo.  3.  After you shampoo, rinse your hair and body thoroughly to remove the  shampoo.                           4.  Use CHG as you would any other liquid soap.  You can apply chg directly  to the skin and wash                       Gently with a scrungie or clean washcloth.  5.  Apply the CHG Soap to your body ONLY FROM THE NECK DOWN.   Do not use on face/ open                           Wound or open sores. Avoid contact with eyes, ears mouth and genitals (private parts).  Wash face,  Genitals (private parts) with your normal soap.             6.  Wash thoroughly, paying special attention to the area where your surgery  will be performed.  7.  Thoroughly rinse your body with warm water from the neck down.  8.  DO NOT shower/wash with your normal soap after using and rinsing off  the CHG Soap.                9.  Pat yourself dry with a clean towel.            10.  Wear clean pajamas.            11.  Place clean sheets on your bed the night of your first shower and do not  sleep with pets. Day of Surgery : Do not apply any lotions/deodorants the morning of surgery.  Please wear clean clothes to the hospital/surgery center.  FAILURE TO FOLLOW THESE INSTRUCTIONS MAY RESULT IN THE CANCELLATION OF YOUR SURGERY PATIENT SIGNATURE_________________________________  NURSE SIGNATURE__________________________________  ________________________________________________________________________

## 2014-09-13 NOTE — Progress Notes (Signed)
BMP results in epic from PAT visit 09/13/2014 sent to Dr Tonita Cong

## 2014-09-14 NOTE — Anesthesia Preprocedure Evaluation (Addendum)
Anesthesia Evaluation  Patient identified by MRN, date of birth, ID band Patient awake    Reviewed: Allergy & Precautions, NPO status , Patient's Chart, lab work & pertinent test results  Airway Mallampati: I       Dental  (+) Teeth Intact   Pulmonary former smoker,  breath sounds clear to auscultation        Cardiovascular negative cardio ROS  Rhythm:Regular Rate:Normal     Neuro/Psych negative neurological ROS     GI/Hepatic negative GI ROS, Neg liver ROS,   Endo/Other  negative endocrine ROS  Renal/GU negative Renal ROS     Musculoskeletal   Abdominal   Peds  Hematology negative hematology ROS (+)   Anesthesia Other Findings   Reproductive/Obstetrics                            Anesthesia Physical Anesthesia Plan  ASA: I  Anesthesia Plan: General   Post-op Pain Management:    Induction: Intravenous  Airway Management Planned: LMA  Additional Equipment:   Intra-op Plan:   Post-operative Plan: Extubation in OR  Informed Consent: I have reviewed the patients History and Physical, chart, labs and discussed the procedure including the risks, benefits and alternatives for the proposed anesthesia with the patient or authorized representative who has indicated his/her understanding and acceptance.   Dental advisory given  Plan Discussed with: CRNA and Surgeon  Anesthesia Plan Comments:         Anesthesia Quick Evaluation

## 2014-09-15 ENCOUNTER — Encounter (HOSPITAL_COMMUNITY)
Admission: RE | Disposition: A | Discharge status: Home / Self Care | Payer: Self-pay | Source: Ambulatory Visit | Attending: Specialist

## 2014-09-15 ENCOUNTER — Ambulatory Visit (HOSPITAL_COMMUNITY): Payer: Medicare Other

## 2014-09-15 ENCOUNTER — Ambulatory Visit (HOSPITAL_COMMUNITY): Payer: Medicare Other | Admitting: Anesthesiology

## 2014-09-15 ENCOUNTER — Ambulatory Visit (HOSPITAL_COMMUNITY)
Admission: RE | Admit: 2014-09-15 | Discharge: 2014-09-15 | Disposition: A | Discharge status: Home / Self Care | Payer: Medicare Other | Source: Ambulatory Visit | Attending: Specialist | Admitting: Specialist

## 2014-09-15 ENCOUNTER — Encounter (HOSPITAL_COMMUNITY): Payer: Self-pay | Admitting: *Deleted

## 2014-09-15 DIAGNOSIS — Y658 Other specified misadventures during surgical and medical care: Secondary | ICD-10-CM | POA: Insufficient documentation

## 2014-09-15 DIAGNOSIS — R52 Pain, unspecified: Secondary | ICD-10-CM

## 2014-09-15 DIAGNOSIS — Z472 Encounter for removal of internal fixation device: Secondary | ICD-10-CM | POA: Diagnosis not present

## 2014-09-15 DIAGNOSIS — Z7982 Long term (current) use of aspirin: Secondary | ICD-10-CM | POA: Diagnosis not present

## 2014-09-15 DIAGNOSIS — Z969 Presence of functional implant, unspecified: Secondary | ICD-10-CM

## 2014-09-15 DIAGNOSIS — T8484XA Pain due to internal orthopedic prosthetic devices, implants and grafts, initial encounter: Secondary | ICD-10-CM | POA: Insufficient documentation

## 2014-09-15 DIAGNOSIS — T84498A Other mechanical complication of other internal orthopedic devices, implants and grafts, initial encounter: Secondary | ICD-10-CM | POA: Diagnosis not present

## 2014-09-15 DIAGNOSIS — Z9889 Other specified postprocedural states: Secondary | ICD-10-CM | POA: Diagnosis not present

## 2014-09-15 HISTORY — PX: HARDWARE REMOVAL: SHX979

## 2014-09-15 SURGERY — REMOVAL, HARDWARE
Anesthesia: General | Site: Ankle | Laterality: Right

## 2014-09-15 MED ORDER — BUPIVACAINE-EPINEPHRINE (PF) 0.5% -1:200000 IJ SOLN
INTRAMUSCULAR | Status: AC
  Filled 2014-09-15: qty 30

## 2014-09-15 MED ORDER — PROMETHAZINE HCL 25 MG/ML IJ SOLN: 6.2500 mg | INTRAMUSCULAR | Status: DC | PRN

## 2014-09-15 MED ORDER — LIDOCAINE HCL (CARDIAC) 20 MG/ML IV SOLN
INTRAVENOUS | Status: DC | PRN
  Administered 2014-09-15: 100 mg via INTRAVENOUS

## 2014-09-15 MED ORDER — FENTANYL CITRATE 0.05 MG/ML IJ SOLN
INTRAMUSCULAR | Status: DC | PRN
  Administered 2014-09-15 (×2): 50 ug via INTRAVENOUS

## 2014-09-15 MED ORDER — OXYCODONE HCL 5 MG PO TABS: 5.0000 mg | ORAL_TABLET | Freq: Once | ORAL | Status: DC | PRN

## 2014-09-15 MED ORDER — ONDANSETRON HCL 4 MG/2ML IJ SOLN
INTRAMUSCULAR | Status: DC | PRN
  Administered 2014-09-15: 4 mg via INTRAVENOUS

## 2014-09-15 MED ORDER — MIDAZOLAM HCL 5 MG/5ML IJ SOLN
INTRAMUSCULAR | Status: DC | PRN
  Administered 2014-09-15: 2 mg via INTRAVENOUS

## 2014-09-15 MED ORDER — CLINDAMYCIN PHOSPHATE 900 MG/50ML IV SOLN
INTRAVENOUS | Status: AC
  Filled 2014-09-15: qty 50

## 2014-09-15 MED ORDER — DOCUSATE SODIUM 100 MG PO CAPS: 100.0000 mg | ORAL_CAPSULE | Freq: Two times a day (BID) | ORAL | Status: DC | PRN

## 2014-09-15 MED ORDER — FENTANYL CITRATE 0.05 MG/ML IJ SOLN
INTRAMUSCULAR | Status: AC
  Filled 2014-09-15: qty 5

## 2014-09-15 MED ORDER — LACTATED RINGERS IV SOLN
INTRAVENOUS | Status: DC
  Administered 2014-09-15 (×2): via INTRAVENOUS

## 2014-09-15 MED ORDER — ATROPINE SULFATE 0.4 MG/ML IJ SOLN
INTRAMUSCULAR | Status: AC
  Filled 2014-09-15: qty 2

## 2014-09-15 MED ORDER — OXYCODONE-ACETAMINOPHEN 5-325 MG PO TABS
1.0000 | ORAL_TABLET | ORAL | Status: DC | PRN
  Administered 2014-09-15: 1 via ORAL
  Filled 2014-09-15: qty 1

## 2014-09-15 MED ORDER — HYDROMORPHONE HCL 1 MG/ML IJ SOLN
0.2500 mg | INTRAMUSCULAR | Status: DC | PRN
  Administered 2014-09-15 (×2): 0.5 mg via INTRAVENOUS

## 2014-09-15 MED ORDER — LIDOCAINE HCL (CARDIAC) 20 MG/ML IV SOLN
INTRAVENOUS | Status: AC
  Filled 2014-09-15: qty 5

## 2014-09-15 MED ORDER — PROPOFOL 10 MG/ML IV BOLUS
INTRAVENOUS | Status: DC | PRN
  Administered 2014-09-15: 180 mg via INTRAVENOUS

## 2014-09-15 MED ORDER — OXYCODONE HCL 5 MG/5ML PO SOLN
5.0000 mg | Freq: Once | ORAL | Status: DC | PRN
  Filled 2014-09-15: qty 5

## 2014-09-15 MED ORDER — POLYMYXIN B SULFATE 500000 UNITS IJ SOLR
INTRAMUSCULAR | Status: DC | PRN
  Administered 2014-09-15: 500 mL

## 2014-09-15 MED ORDER — BUPIVACAINE HCL (PF) 0.5 % IJ SOLN
INTRAMUSCULAR | Status: DC | PRN
  Administered 2014-09-15: 30 mL

## 2014-09-15 MED ORDER — ONDANSETRON HCL 4 MG/2ML IJ SOLN
INTRAMUSCULAR | Status: AC
  Filled 2014-09-15: qty 2

## 2014-09-15 MED ORDER — MIDAZOLAM HCL 2 MG/2ML IJ SOLN
INTRAMUSCULAR | Status: AC
  Filled 2014-09-15: qty 2

## 2014-09-15 MED ORDER — OXYCODONE-ACETAMINOPHEN 5-325 MG PO TABS: 1.0000 | ORAL_TABLET | ORAL | Status: DC | PRN

## 2014-09-15 MED ORDER — POLYMYXIN B SULFATE 500000 UNITS IJ SOLR
INTRAMUSCULAR | Status: AC
  Filled 2014-09-15: qty 1

## 2014-09-15 MED ORDER — CLINDAMYCIN PHOSPHATE 900 MG/50ML IV SOLN
900.0000 mg | INTRAVENOUS | Status: AC
  Administered 2014-09-15: 900 mg via INTRAVENOUS

## 2014-09-15 MED ORDER — PROPOFOL 10 MG/ML IV BOLUS
INTRAVENOUS | Status: AC
  Filled 2014-09-15: qty 20

## 2014-09-15 MED ORDER — BUPIVACAINE HCL (PF) 0.5 % IJ SOLN
INTRAMUSCULAR | Status: AC
  Filled 2014-09-15: qty 30

## 2014-09-15 MED ORDER — SODIUM CHLORIDE 0.9 % IJ SOLN
INTRAMUSCULAR | Status: AC
  Filled 2014-09-15: qty 10

## 2014-09-15 MED ORDER — HYDROMORPHONE HCL 1 MG/ML IJ SOLN
INTRAMUSCULAR | Status: AC
  Filled 2014-09-15: qty 1

## 2014-09-15 MED ORDER — EPHEDRINE SULFATE 50 MG/ML IJ SOLN
INTRAMUSCULAR | Status: AC
  Filled 2014-09-15: qty 1

## 2014-09-15 SURGICAL SUPPLY — 50 items
BANDAGE ELASTIC 4 VELCRO ST LF (GAUZE/BANDAGES/DRESSINGS) ×3 IMPLANT
BANDAGE ELASTIC 6 VELCRO ST LF (GAUZE/BANDAGES/DRESSINGS) ×3 IMPLANT
BANDAGE ESMARK 6X9 LF (GAUZE/BANDAGES/DRESSINGS) ×1 IMPLANT
BLADE SURG 15 STRL LF DISP TIS (BLADE) ×1 IMPLANT
BLADE SURG 15 STRL SS (BLADE) ×2
BNDG ESMARK 6X9 LF (GAUZE/BANDAGES/DRESSINGS) ×3
CLOSURE WOUND 1/2 X4 (GAUZE/BANDAGES/DRESSINGS)
CLOTH 2% CHLOROHEXIDINE 3PK (PERSONAL CARE ITEMS) ×3 IMPLANT
DRAPE C-ARM 42X120 X-RAY (DRAPES) ×3 IMPLANT
DRAPE C-ARMOR (DRAPES) ×3 IMPLANT
DRAPE EXTREMITY T 121X128X90 (DRAPE) ×3 IMPLANT
DRAPE U-SHAPE 47X51 STRL (DRAPES) ×3 IMPLANT
DRSG EMULSION OIL 3X16 NADH (GAUZE/BANDAGES/DRESSINGS) IMPLANT
DRSG EMULSION OIL 3X3 NADH (GAUZE/BANDAGES/DRESSINGS) ×3 IMPLANT
DRSG PAD ABDOMINAL 8X10 ST (GAUZE/BANDAGES/DRESSINGS) IMPLANT
ELECT REM PT RETURN 9FT ADLT (ELECTROSURGICAL) ×3
ELECTRODE REM PT RTRN 9FT ADLT (ELECTROSURGICAL) ×1 IMPLANT
GAUZE SPONGE 4X4 12PLY STRL (GAUZE/BANDAGES/DRESSINGS) ×3 IMPLANT
GLOVE BIO SURGEON STRL SZ8 (GLOVE) ×3 IMPLANT
GLOVE BIOGEL PI IND STRL 7.0 (GLOVE) ×1 IMPLANT
GLOVE BIOGEL PI IND STRL 7.5 (GLOVE) ×1 IMPLANT
GLOVE BIOGEL PI IND STRL 8 (GLOVE) ×1 IMPLANT
GLOVE BIOGEL PI IND STRL 8.5 (GLOVE) ×1 IMPLANT
GLOVE BIOGEL PI INDICATOR 7.0 (GLOVE) ×2
GLOVE BIOGEL PI INDICATOR 7.5 (GLOVE) ×2
GLOVE BIOGEL PI INDICATOR 8 (GLOVE) ×2
GLOVE BIOGEL PI INDICATOR 8.5 (GLOVE) ×2
GLOVE SURG SS PI 6.5 STRL IVOR (GLOVE) ×3 IMPLANT
GLOVE SURG SS PI 7.5 STRL IVOR (GLOVE) ×3 IMPLANT
GLOVE SURG SS PI 8.0 STRL IVOR (GLOVE) ×6 IMPLANT
GOWN STRL REUS W/TWL LRG LVL3 (GOWN DISPOSABLE) ×3 IMPLANT
GOWN STRL REUS W/TWL XL LVL3 (GOWN DISPOSABLE) ×9 IMPLANT
KIT BASIN OR (CUSTOM PROCEDURE TRAY) ×3 IMPLANT
MANIFOLD NEPTUNE II (INSTRUMENTS) ×3 IMPLANT
NEEDLE HYPO 22GX1.5 SAFETY (NEEDLE) ×3 IMPLANT
PACK GENERAL/GYN (CUSTOM PROCEDURE TRAY) ×3 IMPLANT
PAD CAST 4YDX4 CTTN HI CHSV (CAST SUPPLIES) ×1 IMPLANT
PADDING CAST COTTON 4X4 STRL (CAST SUPPLIES) ×2
PADDING CAST COTTON 6X4 STRL (CAST SUPPLIES) ×3 IMPLANT
POSITIONER SURGICAL ARM (MISCELLANEOUS) ×3 IMPLANT
STAPLER VISISTAT (STAPLE) ×3 IMPLANT
STAPLER VISISTAT 35W (STAPLE) IMPLANT
STRIP CLOSURE SKIN 1/2X4 (GAUZE/BANDAGES/DRESSINGS) IMPLANT
SUT MNCRL AB 4-0 PS2 18 (SUTURE) IMPLANT
SUT VIC AB 1 CT1 27 (SUTURE) ×2
SUT VIC AB 1 CT1 27XBRD ANTBC (SUTURE) ×1 IMPLANT
SUT VIC AB 2-0 CT1 27 (SUTURE)
SUT VIC AB 2-0 CT1 TAPERPNT 27 (SUTURE) IMPLANT
SUT VIC AB 2-0 CT2 27 (SUTURE) ×3 IMPLANT
SYR CONTROL 10ML LL (SYRINGE) ×3 IMPLANT

## 2014-09-15 NOTE — Transfer of Care (Signed)
Immediate Anesthesia Transfer of Care Note  Patient: Derek Browning  Procedure(s) Performed: Procedure(s): REMOVAL RETAINED HARDWARE RIGHT ANKLE (Right)  Patient Location: PACU  Anesthesia Type:General  Level of Consciousness: awake, alert , oriented and patient cooperative  Airway & Oxygen Therapy: Patient Spontanous Breathing and Patient connected to face mask oxygen  Post-op Assessment: Report given to RN, Post -op Vital signs reviewed and stable and Patient moving all extremities  Post vital signs: Reviewed and stable  Last Vitals:  Filed Vitals:   09/15/14 0506  BP: 154/87  Pulse: 71  Temp: 36.6 C  Resp: 18    Complications: No apparent anesthesia complications

## 2014-09-15 NOTE — H&P (View-Only) (Signed)
Derek Browning is an 64 y.o. male.   Chief Complaint: R ankle pain, slow healing wound HPI: The patient is a 65 year old male who presents for a follow-up for Post op. 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 Post operative pain has been mild (2/10). The patient does report pain (worse at night), swelling and redness, while the patient does not report drainage Pain medications include: Percocet . The patient is on the following DVT prophylaxis treatment: Aspirin . The patient is currently 100 percent weightbearing without the use of assistive devices. The patient is not using a brace.   Past Medical History  Diagnosis Date  . Arthritis     Past Surgical History  Procedure Laterality Date  . Appendectomy    . C5 disc surgery   1989  . Orif ankle fracture Right 11/24/2013    Procedure: OPEN REDUCTION INTERNAL FIXATION (ORIF) RIGHT ANKLE;  Surgeon: [name], MD;  Location: [facility];  Service: Orthopedics;  Laterality: Right;  . Orif ankle fracture Right 02/18/2014    Procedure: SCREW REMOVAL, REVISION OPEN REDUCTION INTERNAL FIXATION RIGHT  ANKLE;  Surgeon: [name], MD;  Location: [facility];  Service: Orthopedics;  Laterality: Right;    No family history on file. Social History:  reports that he quit smoking about 16 years ago. He has never used smokeless tobacco. He reports that he drinks about 3.0 oz of alcohol per week. He reports that he does not use illicit drugs.  Allergies:  Allergies  Allergen Reactions  . Cefepime Rash    No swelling or difficulty breathing     (Not in a hospital admission)  No results found for this or any previous visit (from the past 48 hour(s)). No results found.  Review of Systems  Constitutional: Negative.   HENT: Negative.   Eyes: Negative.   Respiratory: Negative.   Cardiovascular: Negative.    Gastrointestinal: Negative.   Genitourinary: Negative.   Musculoskeletal: Positive for joint pain.  Skin: Negative.   Neurological: Negative.   Psychiatric/Behavioral: Negative.     There were no vitals taken for this visit. Physical Exam  Constitutional: He is oriented to person, place, and time. He appears well-developed and well-nourished.  HENT:  Head: Normocephalic and atraumatic.  Eyes: Conjunctivae and EOM are normal. Pupils are equal, round, and reactive to light.  Neck: Normal range of motion. Neck supple.  Cardiovascular: Normal rate and regular rhythm.   Respiratory: Effort normal and breath sounds normal.  GI: Soft. Bowel sounds are normal.  Musculoskeletal:  On exam medial malleolus small area with an eschar at the center of it with prominence where the hardware was.  Neurological: He is alert and oriented to person, place, and time. He has normal reflexes.  Skin: Skin is warm and dry.  Psychiatric: He has a normal mood and affect.    X rays demonstrate apparent healed fracture. I do not see the cavity extending down to the hardware. There is no fluctuance of infection. He has good subtalar and tibiotalar motion.  Assessment/Plan Retained hardware R ankle  Five months status post ORIF redo of the ankle with probable hardware prominence and intermittent wound eschar. No evidence of infection.  We will continue protection. We will schedule him for revision and hardware removal. Discussed risks and benefits including partial removal of the hardware, persistent symptoms, refracture,  postoperative course. He has a prescription for Doxycycline, local wound care, protective measurements. He wants to go to [place]. If he has any changes in the interim he is to call. He got a prescription for Doxycycline and Percocet.  Plan Removal of retained hardware R ankle   Derek Browning M. PA-C for Dr. Tonita Cong 08/26/2014, 1:32 PM

## 2014-09-15 NOTE — Op Note (Signed)
NAMEROLONDO, PIERRE                ACCOUNT NO.:  [id number]  MEDICAL RECORD NO.:  [id number]  LOCATION:  [facility]                         FACILITY:  [facility]  PHYSICIAN:  Susa Day, M.D.    DATE OF BIRTH:  09-06-1950  DATE OF PROCEDURE:  09/15/2014 DATE OF DISCHARGE:                              OPERATIVE REPORT   PREOPERATIVE DIAGNOSIS:  Painful retained hardware of the right ankle, status post ORIF, ankle fracture.  POSTOPERATIVE DIAGNOSIS:  Painful retained hardware of the right ankle, status post ORIF, ankle fracture.  PROCEDURES PERFORMED: 1. Removal of hardware, right ankle. 2. Debridement of bursa. 3. Intraoperative stress radiographs of the ankle.  ANESTHESIA:  General.  ASSISTANT:  Cleophas Dunker, PA.  HISTORY:  This is a 64 year old who is status post ORIF of the ankle. He had severe fracture.  This has required reconstruction of the medial malleolus with internal fixation which included a plate, screws, and a TriMed internal device.  Patient went on to healing of the fracture but had persistent pain and abrasion type lesions due to the prominence of the medial malleolus secondary to the hardware.  He was indicated for removal.  Risks and benefits discussed including bleeding, infection, refracture, inability to remove all the hardware, DVT, PE, anesthetic complications, etc.  TECHNIQUE:  With the patient in supine position after induction of adequate general anesthesia, 900 clindamycin, right lower extremity was prepped and draped in usual sterile fashion.  Previous surgical incision was utilized.  There was a small __________ in the area of the fibrotic tissue in the midportion of the incision that was excised, this was about a quarter of a centimeter.  We sharply dissected down to the plate.  There was a bursa overlying plate, this was debrided.  There was hypertrophic tissue here which was debrided as well.  We skeletonized the cephalad portion of the sled  plate and screws as well as the distal portion.  The screws were removed with a screwdriver.  We curetted the screw holes and irrigated them.  I then removed the K-wire with a needle driver.  We then used multiple techniques to carefully remove the sled which was a paper clip type device.  We used a tamp proximally and towel clips distally, combination to check the device.  This was ejected, curetted the holes, the ankle was stable following this.  Intraoperative stress radiographs demonstrated a healed fracture and a stable ankle joint.  We copiously irrigated the wound and deeply reapproximated the periosteum and tissue with 0 Vicryl simple sutures, subcu with 2-0, and skin reapproximated with staples.  Wound was dressed sterilely.  He was awoken without difficulty and transported to the recovery room in satisfactory condition.  The patient tolerated the procedure well.  No complications.  Assistant, Cleophas Dunker, [place].  Minimal blood loss.     Susa Day, M.D.     Geralynn Rile  D:  09/15/2014  T:  09/15/2014  Job:  [id number]

## 2014-09-15 NOTE — Anesthesia Procedure Notes (Signed)
Procedure Name: LMA Insertion Date/Time: 09/15/2014 7:40 AM Performed by: Carleene Cooper A Pre-anesthesia Checklist: Patient identified, Timeout performed, Emergency Drugs available, Patient being monitored and Suction available Patient Re-evaluated:Patient Re-evaluated prior to inductionOxygen Delivery Method: Circle system utilized Preoxygenation: Pre-oxygenation with 100% oxygen Intubation Type: IV induction Ventilation: Mask ventilation without difficulty LMA: LMA with gastric port inserted LMA Size: 4.0 Number of attempts: 1 Placement Confirmation: positive ETCO2 and breath sounds checked- equal and bilateral Tube secured with: Tape Dental Injury: Teeth and Oropharynx as per pre-operative assessment

## 2014-09-15 NOTE — Discharge Instructions (Signed)
Keep dressing clean and dry at all times Remain nonweightbearing right leg unless in CAM walker boot May partially weight bear right leg (50%) in CAM walker only, otherwise remain non weightbearing May remove CAM walker while resting, to ice and elevate Take aspirin 325mg  daily to prevent blood clots Follow up in 1 week in office with Dr. Tonita Cong for dressing change/wound check Ice and elevate right foot/ankle 5x/day, 20 minutes each to prevent swelling

## 2014-09-15 NOTE — Anesthesia Postprocedure Evaluation (Signed)
  Anesthesia Post-op Note  Patient: Derek Browning  Procedure(s) Performed: Procedure(s): REMOVAL RETAINED HARDWARE RIGHT ANKLE (Right)  Patient Location: PACU  Anesthesia Type:General  Level of Consciousness: awake and alert   Airway and Oxygen Therapy: Patient Spontanous Breathing  Post-op Pain: mild  Post-op Assessment: Post-op Vital signs reviewed  Post-op Vital Signs: stable  Last Vitals:  Filed Vitals:   09/15/14 1000  BP: 134/81  Pulse:   Temp:   Resp: 10    Complications: No apparent anesthesia complications

## 2014-09-15 NOTE — Interval H&P Note (Signed)
History and Physical Interval Note:  09/15/2014 7:29 AM  Colon Flattery  has presented today for surgery, with the diagnosis of RETAINED HARDWARE RIGHT ANKLE  The various methods of treatment have been discussed with the patient and family. After consideration of risks, benefits and other options for treatment, the patient has consented to  Procedure(s): REMOVAL RETAINED HARDWARE RIGHT ANKLE (Right) as a surgical intervention .  The patient's history has been reviewed, patient examined, no change in status, stable for surgery.  I have reviewed the patient's chart and labs.  Questions were answered to the patient's satisfaction.     Derek Browning

## 2014-09-15 NOTE — Brief Op Note (Signed)
09/15/2014  8:43 AM  PATIENT:  Derek Browning  64 y.o. male  PRE-OPERATIVE DIAGNOSIS:  RETAINED HARDWARE RIGHT ANKLE  POST-OPERATIVE DIAGNOSIS:  RETAINED HARDWARE RIGHT ANKLE  PROCEDURE:  Procedure(s): REMOVAL RETAINED HARDWARE RIGHT ANKLE (Right)  SURGEON:  Surgeon(s) and Role:    * Susa Day, MD - Primary  PHYSICIAN ASSISTANT:   ASSISTANTS: [place]   ANESTHESIA:   general  EBL:  Total I/O In: 1000 [I.V.:1000] Out: 20 [Blood:20]  BLOOD ADMINISTERED:none  DRAINS: none   LOCAL MEDICATIONS USED:  MARCAINE     SPECIMEN:  No Specimen  DISPOSITION OF SPECIMEN:  N/A  COUNTS:  YES  TOURNIQUET:  * No tourniquets in log *  DICTATION: .Other Dictation: Dictation Number [id number]  PLAN OF CARE: Discharge to home after PACU  PATIENT DISPOSITION:  PACU - hemodynamically stable.   Delay start of Pharmacological VTE agent (>24hrs) due to surgical blood loss or risk of bleeding: no

## 2014-09-16 ENCOUNTER — Encounter (HOSPITAL_COMMUNITY): Payer: Self-pay | Admitting: Specialist

## 2014-09-21 DIAGNOSIS — Z4789 Encounter for other orthopedic aftercare: Secondary | ICD-10-CM | POA: Diagnosis not present

## 2014-09-28 DIAGNOSIS — S82841D Displaced bimalleolar fracture of right lower leg, subsequent encounter for closed fracture with routine healing: Secondary | ICD-10-CM | POA: Diagnosis not present

## 2015-06-01 ENCOUNTER — Ambulatory Visit (INDEPENDENT_AMBULATORY_CARE_PROVIDER_SITE_OTHER): Payer: Medicare Other | Admitting: Emergency Medicine

## 2015-06-01 VITALS — BP 132/86 | HR 80 | Temp 98.1°F | Resp 18 | Ht 67.25 in | Wt 158.2 lb

## 2015-06-01 DIAGNOSIS — R142 Eructation: Secondary | ICD-10-CM | POA: Diagnosis not present

## 2015-06-01 MED ORDER — BACLOFEN 10 MG PO TABS: 10.0000 mg | ORAL_TABLET | Freq: Three times a day (TID) | ORAL | Status: DC

## 2015-06-01 MED ORDER — LANSOPRAZOLE 30 MG PO CPDR: 30.0000 mg | DELAYED_RELEASE_CAPSULE | Freq: Every day | ORAL | Status: DC

## 2015-06-01 NOTE — Patient Instructions (Signed)

## 2015-06-01 NOTE — Progress Notes (Signed)
Subjective:  Patient ID: Derek Browning, male    DOB: Mar 14, 1951  Age: 64 y.o. MRN: [id number]  CC: Abdominal Pain   HPI Derek Browning presents  patient has a month history of frequent he has some heartburn. Note symptoms suggestive of reflux. Nausea vomiting or stool change. He doesn't smoke,. Chew gum. He does not use a straw. He has no history of anxiety. Is no nausea vomiting or stool change. No abdominal pain  History Derek Browning has no past medical history on file.   He has past surgical history that includes Appendectomy; c5 disc surgery  (1989); ORIF ankle fracture (Right, 11/24/2013); ORIF ankle fracture (Right, 02/18/2014); and Hardware Removal (Right, 09/15/2014).   His  family history is not on file.  He   reports that he quit smoking about 16 years ago. His smoking use included Cigarettes. He has a 50 pack-year smoking history. He has never used smokeless tobacco. He reports that he drinks alcohol. He reports that he does not use illicit drugs.  Outpatient Prescriptions Prior to Visit  Medication Sig Dispense Refill  . Ascorbic Acid (VITAMIN C PO) Take 1 tablet by mouth every morning.    Marland Kitchen aspirin 325 MG tablet Take 325 mg by mouth every morning.    Marland Kitchen CALCIUM PO Take 1 tablet by mouth every morning.    . docusate sodium (COLACE) 100 MG capsule Take 1 capsule (100 mg total) by mouth 2 (two) times daily as needed for mild constipation. (Patient not taking: Reported on 06/01/2015) 20 capsule 1  . docusate sodium (COLACE) 100 MG capsule Take 1 capsule (100 mg total) by mouth 2 (two) times daily as needed for mild constipation. (Patient not taking: Reported on 06/01/2015) 20 capsule 1  . methocarbamol (ROBAXIN) 500 MG tablet Take 1 tablet (500 mg total) by mouth 3 (three) times daily between meals as needed for muscle spasms. (Patient not taking: Reported on 06/01/2015) 40 tablet 1  . Multiple Vitamin (MULTIVITAMIN WITH MINERALS) TABS tablet Take 1 tablet by mouth every morning.    Marland Kitchen  oxyCODONE-acetaminophen (PERCOCET) 5-325 MG per tablet Take 1 tablet by mouth every 4 (four) hours as needed. (Patient not taking: Reported on 06/01/2015) 40 tablet 0   No facility-administered medications prior to visit.    Social History   Social History  . Marital Status: Married    Spouse Name: N/A  . Number of Children: N/A  . Years of Education: N/A   Social History Main Topics  . Smoking status: Former Smoker -- 2.00 packs/day for 25 years    Types: Cigarettes    Quit date: 07/01/1998  . Smokeless tobacco: Never Used  . Alcohol Use: Yes     Comment: occas beer  . Drug Use: No  . Sexual Activity: Not Asked   Other Topics Concern  . None   Social History Narrative     Review of Systems  Constitutional: Negative for fever, chills and appetite change.  HENT: Negative for congestion, ear pain, postnasal drip, sinus pressure and sore throat.   Eyes: Negative for pain and redness.  Respiratory: Negative for cough, shortness of breath and wheezing.   Cardiovascular: Negative for leg swelling.  Gastrointestinal: Positive for abdominal distention. Negative for nausea, vomiting, abdominal pain, diarrhea, constipation and blood in stool.  Endocrine: Negative for polyuria.  Genitourinary: Negative for dysuria, urgency, frequency and flank pain.  Musculoskeletal: Negative for gait problem.  Skin: Negative for rash.  Neurological: Negative for weakness and headaches.  Psychiatric/Behavioral: Negative for  confusion and decreased concentration. The patient is not nervous/anxious.     Objective:  BP 132/86 mmHg  Pulse 80  Temp(Src) 98.1 F (36.7 C) (Oral)  Resp 18  Ht 5' 7.25" (1.708 m)  Wt 158 lb 3.2 oz (71.759 kg)  BMI 24.60 kg/m2  SpO2 98%  Physical Exam  Constitutional: He is oriented to person, place, and time. He appears well-developed and well-nourished. No distress.  HENT:  Head: Normocephalic and atraumatic.  Right Ear: External ear normal.  Left Ear:  External ear normal.  Nose: Nose normal.  Eyes: Conjunctivae and EOM are normal. Pupils are equal, round, and reactive to light. No scleral icterus.  Neck: Normal range of motion. Neck supple. No tracheal deviation present.  Cardiovascular: Normal rate, regular rhythm and normal heart sounds.   Pulmonary/Chest: Effort normal. No respiratory distress. He has no wheezes. He has no rales.  Abdominal: He exhibits no mass. There is no tenderness. There is no rebound and no guarding.  Musculoskeletal: He exhibits no edema.  Lymphadenopathy:    He has no cervical adenopathy.  Neurological: He is alert and oriented to person, place, and time. Coordination normal.  Skin: Skin is warm and dry. No rash noted.  Psychiatric: He has a normal mood and affect. His behavior is normal.      Assessment & Plan:   Derek Browning was seen today for abdominal pain.  Diagnoses and all orders for this visit:  Eructation -     Ambulatory referral to Gastroenterology  Other orders -     baclofen (LIORESAL) 10 MG tablet; Take 1 tablet (10 mg total) by mouth 3 (three) times daily. -     lansoprazole (PREVACID) 30 MG capsule; Take 1 capsule (30 mg total) by mouth daily at 12 noon.   I am having Derek Browning start on baclofen and lansoprazole. I am also having him maintain his docusate sodium, methocarbamol, multivitamin with minerals, Ascorbic Acid (VITAMIN C PO), CALCIUM PO, aspirin, oxyCODONE-acetaminophen, and docusate sodium.  Meds ordered this encounter  Medications  . baclofen (LIORESAL) 10 MG tablet    Sig: Take 1 tablet (10 mg total) by mouth 3 (three) times daily.    Dispense:  90 each    Refill:  0  . lansoprazole (PREVACID) 30 MG capsule    Sig: Take 1 capsule (30 mg total) by mouth daily at 12 noon.    Dispense:  30 capsule    Refill:  2    Appropriate red flag conditions were discussed with the patient as well as actions that should be taken.  Patient expressed his understanding.  Follow-up:  Return if symptoms worsen or fail to improve.  Roselee Culver, MD

## 2015-06-02 ENCOUNTER — Encounter: Payer: Self-pay | Admitting: Gastroenterology

## 2015-08-02 ENCOUNTER — Encounter: Payer: Self-pay | Admitting: Gastroenterology

## 2015-08-02 ENCOUNTER — Ambulatory Visit (INDEPENDENT_AMBULATORY_CARE_PROVIDER_SITE_OTHER): Payer: Medicare Other | Admitting: Gastroenterology

## 2015-08-02 VITALS — BP 154/90 | HR 84 | Ht 65.75 in | Wt 159.5 lb

## 2015-08-02 DIAGNOSIS — R142 Eructation: Secondary | ICD-10-CM | POA: Diagnosis not present

## 2015-08-02 DIAGNOSIS — K5900 Constipation, unspecified: Secondary | ICD-10-CM | POA: Diagnosis not present

## 2015-08-02 DIAGNOSIS — R1310 Dysphagia, unspecified: Secondary | ICD-10-CM | POA: Diagnosis not present

## 2015-08-02 DIAGNOSIS — Z1211 Encounter for screening for malignant neoplasm of colon: Secondary | ICD-10-CM

## 2015-08-02 NOTE — Patient Instructions (Signed)
You have been scheduled for an endoscopy and colonoscopy. Please follow the written instructions given to you at your visit today. Please pick up your prep supplies at the pharmacy within the next 1-3 days. If you use inhalers (even only as needed), please bring them with you on the day of your procedure. Your physician has requested that you go to www.startemmi.com and enter the access code given to you at your visit today. This web site gives a general overview about your procedure. However, you should still follow specific instructions given to you by our office regarding your preparation for the procedure.   We have sent the following medications to your pharmacy for you to pick up at your convenience: Suprep

## 2015-08-02 NOTE — Progress Notes (Signed)
HPI :  65 y/o male seen in consultation for frequent belching and constipation.  He states his main complaint has been ongoing for 6-7 months and getting worse. He reports constant belching throughout the day, bothering him frequently. It does not make a difference what he eats. It occurs every day despite whether he eats or not. It doesn't bother him with sleep however and does not wake him. He does endorse some heartburn which bothers him. He has been given a trial of prilosec and prevacid as well. He thinks it helped his heartburn but did not help the belching at all. He does endorse ongoing dysphagia to solids, not to liquids. He sometimes needs to drink liquids to help push food down. He otherwise has some discomfort in his upper abdomen which comes and goes. He feels this discomfort can be improved with eating. He was given a trial of baclofen he thinks caused sedation and he did not like the way it made him feel. He has had a remote EGD in the 1980s, unsure of what this found. He is a former tobacco user.   He denies any changes in his bowels or blood in the stools. He has has daily stools, but passes hard stools at times which bothers him.   He has had a prior colonoscopy about 15 years ago, which was a normal exam per his report. No FH of CRC. No FH of esophageal or gastric cancer.     Past Medical History  Diagnosis Date  . Sepsis ([place]) 2014    after surgery  . GERD (gastroesophageal reflux disease)       Past Surgical History  Procedure Laterality Date  . Appendectomy  1958  . Cervical disc surgery  1989    C5  . Orif ankle fracture Right 11/24/2013    Procedure: OPEN REDUCTION INTERNAL FIXATION (ORIF) RIGHT ANKLE;  Surgeon: [name], MD;  Location: [facility];  Service: Orthopedics;  Laterality: Right;  . Orif ankle fracture Right 02/18/2014    Procedure: SCREW REMOVAL, REVISION OPEN REDUCTION INTERNAL FIXATION RIGHT  ANKLE;  Surgeon: [name], MD;  Location: [facility];   Service: Orthopedics;  Laterality: Right;  . Hardware removal Right 09/15/2014    Procedure: REMOVAL RETAINED HARDWARE RIGHT ANKLE;  Surgeon: Susa Day, MD;  Location: [facility];  Service: Orthopedics;  Laterality: Right;   History reviewed. No pertinent family history. Social History  Substance Use Topics  . Smoking status: Former Smoker -- 2.00 packs/day for 25 years    Types: Cigarettes    Quit date: 07/01/2000  . Smokeless tobacco: Never Used  . Alcohol Use: 0.0 oz/week    0 Standard drinks or equivalent per week     Comment: occas beer   No current outpatient prescriptions on file.   No current facility-administered medications for this visit.   Allergies  Allergen Reactions  . Cefepime Rash    No swelling or difficulty breathing     Review of Systems: All systems reviewed and negative except where noted in HPI.   Lab Results  Component Value Date   WBC 5.0 09/13/2014   HGB 15.7 09/13/2014   HCT 45.8 09/13/2014   MCV 89.8 09/13/2014   PLT 225 09/13/2014    Lab Results  Component Value Date   CREATININE 1.16 09/13/2014   BUN 12 09/13/2014   NA 138 09/13/2014   K 4.5 09/13/2014   CL 106 09/13/2014   CO2 26 09/13/2014    Lab  Results  Component Value Date   ALT 15 12/08/2013   AST 15 12/08/2013   ALKPHOS 51 12/08/2013   BILITOT 0.9 12/08/2013     Physical Exam: BP 154/90 mmHg  Pulse 84  Ht 5' 5.75" (1.67 m)  Wt 159 lb 8 oz (72.349 kg)  BMI 25.94 kg/m2 Constitutional: Pleasant,well-developed, male in no acute distress. Belching every few minutes throughout our visit today HEENT: Normocephalic and atraumatic. Conjunctivae are normal. No scleral icterus. Neck supple.  Cardiovascular: Normal rate, regular rhythm.  Pulmonary/chest: Effort normal and breath sounds normal. No wheezing, rales or rhonchi. Abdominal: Soft, nondistended, nontender. Bowel sounds active throughout. There are no masses palpable. No hepatomegaly. Extremities: no  edema Lymphadenopathy: No cervical adenopathy noted. Neurological: Alert and oriented to person place and time. Skin: Skin is warm and dry. No rashes noted. Psychiatric: Normal mood and affect. Behavior is normal.   ASSESSMENT AND PLAN:  65 y/o male with PMH as outlined above presenting with frequent bothersome belching as well as intermittent dysphagia. He otherwise has ongoing constipation, and is overdue for CRC screening.   Belching / dysphagia - I observed the patient belch very frequently throughout the visit today, and he seems to inspire just prior to the belch. I suspect he more than likely has supragastric belching / aerophagia causing this and discussed how this is a benign entity usually a result of a repetitive / learned behavior and unlikely represents a serious organic pathology, and reassured him as such. That being said, he does have some dysphagia to solids and I offered him an upper endoscopy to evaluate his dysphagia and the belching. He has already tried a course of baclofen which he did not seem to have a good trial of given it caused sedation and he did not tolerate it. We will await the results of the EGD, and if symptoms persist without pathology to cause this otherwise, consider evaluation by mental health.   CRC screening - the patient is overdue for screening. I discussed colonoscopy with him and we can perform this at the same time as the EGD for screening purposes. He wished to proceed.   Constipation - stable symptoms, recommend daily fiber supplement. Await results of screening colonoscopy.   The indications, risks, and benefits of EGD and colonoscopy were explained to the patient in detail. Risks include but are not limited to bleeding, perforation, adverse reaction to medications, and cardiopulmonary compromise. Sequelae include but are not limited to the possibility of surgery, hositalization, and mortality. The patient verbalized understanding and wished to  proceed. All questions answered, referred to scheduler and bowel prep ordered. Further recommendations pending results of the exam.   [place] Cellar, MD [facility] Gastroenterology Pager [phone]  CC: Roselee Culver, MD

## 2015-08-07 ENCOUNTER — Telehealth: Payer: Self-pay | Admitting: Gastroenterology

## 2015-08-07 DIAGNOSIS — Z1211 Encounter for screening for malignant neoplasm of colon: Secondary | ICD-10-CM

## 2015-08-07 DIAGNOSIS — R1310 Dysphagia, unspecified: Secondary | ICD-10-CM

## 2015-08-07 MED ORDER — NA SULFATE-K SULFATE-MG SULF 17.5-3.13-1.6 GM/177ML PO SOLN: ORAL | Status: DC

## 2015-08-07 NOTE — Telephone Encounter (Signed)
Called pt and informed her that the prep was sent to the pharmacy.

## 2015-09-01 ENCOUNTER — Ambulatory Visit (AMBULATORY_SURGERY_CENTER): Payer: Medicare Other | Admitting: Gastroenterology

## 2015-09-01 ENCOUNTER — Encounter: Payer: Self-pay | Admitting: Gastroenterology

## 2015-09-01 VITALS — BP 135/90 | HR 73 | Temp 97.5°F | Resp 18 | Ht 65.75 in | Wt 159.0 lb

## 2015-09-01 DIAGNOSIS — K209 Esophagitis, unspecified: Secondary | ICD-10-CM | POA: Diagnosis not present

## 2015-09-01 DIAGNOSIS — K2950 Unspecified chronic gastritis without bleeding: Secondary | ICD-10-CM | POA: Diagnosis not present

## 2015-09-01 DIAGNOSIS — D122 Benign neoplasm of ascending colon: Secondary | ICD-10-CM | POA: Diagnosis not present

## 2015-09-01 DIAGNOSIS — R1310 Dysphagia, unspecified: Secondary | ICD-10-CM | POA: Diagnosis not present

## 2015-09-01 DIAGNOSIS — Z1211 Encounter for screening for malignant neoplasm of colon: Secondary | ICD-10-CM | POA: Diagnosis not present

## 2015-09-01 DIAGNOSIS — R142 Eructation: Secondary | ICD-10-CM | POA: Diagnosis not present

## 2015-09-01 MED ORDER — SODIUM CHLORIDE 0.9 % IV SOLN: 500.0000 mL | INTRAVENOUS | Status: DC

## 2015-09-01 MED ORDER — PANTOPRAZOLE SODIUM 40 MG PO TBEC: 40.0000 mg | DELAYED_RELEASE_TABLET | Freq: Every day | ORAL | Status: DC

## 2015-09-01 NOTE — Op Note (Signed)
[place]  [address] [place], [address]   ENDOSCOPY PROCEDURE REPORT  PATIENT: Derek, Browning  MR#: [id number] BIRTHDATE: 1950/11/02 , 58  yrs. old GENDER: male ENDOSCOPIST: Yetta Flock, MD REFERRED BY: PROCEDURE DATE:  09/01/2015 PROCEDURE:  EGD w/ biopsy ASA CLASS:     Class II INDICATIONS:  excessive belching, occasional dysphagia. MEDICATIONS: Propofol 200 mg IV TOPICAL ANESTHETIC:  DESCRIPTION OF PROCEDURE: After the risks benefits and alternatives of the procedure were thoroughly explained, informed consent was obtained.  The LB [id number] endoscope was introduced through the mouth and advanced to the second portion of the duodenum , Without limitations.  The instrument was slowly withdrawn as the mucosa was fully examined.  FINDINGS: The distal esophagus had evidence of LA grade B esophagitis.  The remainder of the esophagus was normal without stenosis or stricture.  The DH, GEJ, and SCJ were located 38cm from the incisors.  The stomach had diffuse mild erythema without focal ulceration or erosion.  Biopsies were taken to rule out H pylori. The duodenal bulb and second portion of the duodenum were normal. Retroflexed views revealed no abnormalities.     The scope was then withdrawn from the patient and the procedure completed. COMPLICATIONS: There were no immediate complications.  ENDOSCOPIC IMPRESSION: LA grade B esophagitis Mild gastritis, biopsies obtained Normal duodenum  RECOMMENDATIONS: Resume diet Resume medications Await pathology results Start protonix 40mg  by mouth once daily for esophagitis    eSigned:  Yetta Flock, MD 09/01/2015 2:36 PM    CC: the patient  PATIENT NAME:  Derek, Browning MR#: [id number]

## 2015-09-01 NOTE — Progress Notes (Signed)
Called to room to assist during endoscopic procedure.  Patient ID and intended procedure confirmed with present staff. Received instructions for my participation in the procedure from the performing physician.  

## 2015-09-01 NOTE — Op Note (Signed)
[place]  [address]. [place], [address]   COLONOSCOPY PROCEDURE REPORT  PATIENT: Derek, Browning  MR#: [id number] BIRTHDATE: [birth year] , 37  yrs. old GENDER: male ENDOSCOPIST: Yetta Flock, MD REFERRED BY: PROCEDURE DATE:  09/01/2015 PROCEDURE:   Colonoscopy, screening, Colonoscopy with snare polypectomy, and Colonoscopy with biopsy First Screening Colonoscopy - Avg.  risk and is 50 yrs.  old or older - No.  Prior Negative Screening - Now for repeat screening. N/A  History of Adenoma - Now for follow-up colonoscopy & has been > or = to 3 yrs.  N/A  Polyps removed today? Yes ASA CLASS:   Class II INDICATIONS:Screening for colonic neoplasia and Colorectal Neoplasm Risk Assessment for this procedure is average risk. MEDICATIONS: Propofol 120 mg IV  DESCRIPTION OF PROCEDURE:   After the risks benefits and alternatives of the procedure were thoroughly explained, informed consent was obtained.  The digital rectal exam revealed no abnormalities of the rectum.   The LB [id number]  endoscope was introduced through the anus and advanced to the cecum, which was identified by both the appendix and ileocecal valve. No adverse events experienced.   The quality of the prep was adequate  The instrument was then slowly withdrawn as the colon was fully examined. Estimated blood loss is zero unless otherwise noted in this procedure report.    COLON FINDINGS: A diminutive sessile polyp was noted in the ascending colon and removed with cold forceps.  A 73mm sessile polyp was noted in the ascending colon and removed with cold snare.  The remainder of the examined colon was normal.  Retroflexed views revealed internal hemorrhoids. The time to cecum = 2.4 Withdrawal time = 10.6   The scope was withdrawn and the procedure completed. COMPLICATIONS: There were no immediate complications.  ENDOSCOPIC IMPRESSION: 2 small colon polyps, removed Internal  hemorrhoids  RECOMMENDATIONS: Await pathology results Resume diet Resume medications  eSigned:  Yetta Flock, MD 09/01/2015 2:30 PM   cc:  the patient

## 2015-09-01 NOTE — Patient Instructions (Signed)
YOU HAD AN ENDOSCOPIC PROCEDURE TODAY AT THE [facility]:   Refer to the procedure report that was given to you for any specific questions about what was found during the examination.  If the procedure report does not answer your questions, please call your gastroenterologist to clarify.  If you requested that your care partner not be given the details of your procedure findings, then the procedure report has been included in a sealed envelope for you to review at your convenience later.  YOU SHOULD EXPECT: Some feelings of bloating in the abdomen. Passage of more gas than usual.  Walking can help get rid of the air that was put into your GI tract during the procedure and reduce the bloating. If you had a lower endoscopy (such as a colonoscopy or flexible sigmoidoscopy) you may notice spotting of blood in your stool or on the toilet paper. If you underwent a bowel prep for your procedure, you may not have a normal bowel movement for a few days.  Please Note:  You might notice some irritation and congestion in your nose or some drainage.  This is from the oxygen used during your procedure.  There is no need for concern and it should clear up in a day or so.  SYMPTOMS TO REPORT IMMEDIATELY:   Following lower endoscopy (colonoscopy or flexible sigmoidoscopy):  Excessive amounts of blood in the stool  Significant tenderness or worsening of abdominal pains  Swelling of the abdomen that is new, acute  Fever of 100F or higher   Following upper endoscopy (EGD)  Vomiting of blood or coffee ground material  New chest pain or pain under the shoulder blades  Painful or persistently difficult swallowing  New shortness of breath  Fever of 100F or higher  Black, tarry-looking stools  For urgent or emergent issues, a gastroenterologist can be reached at any hour by calling [phone].   DIET: Your first meal following the procedure should be a small meal and then it is ok to progress to  your normal diet. Heavy or fried foods are harder to digest and may make you feel nauseous or bloated.  Likewise, meals heavy in dairy and vegetables can increase bloating.  Drink plenty of fluids but you should avoid alcoholic beverages for 24 hours.  ACTIVITY:  You should plan to take it easy for the rest of today and you should NOT DRIVE or use heavy machinery until tomorrow (because of the sedation medicines used during the test).    FOLLOW UP: Our staff will call the number listed on your records the next business day following your procedure to check on you and address any questions or concerns that you may have regarding the information given to you following your procedure. If we do not reach you, we will leave a message.  However, if you are feeling well and you are not experiencing any problems, there is no need to return our call.  We will assume that you have returned to your regular daily activities without incident.  If any biopsies were taken you will be contacted by phone or by letter within the next 1-3 weeks.  Please call us at [phone] if you have not heard about the biopsies in 3 weeks.    SIGNATURES/CONFIDENTIALITY: You and/or your care partner have signed paperwork which will be entered into your electronic medical record.  These signatures attest to the fact that that the information above on your After Visit Summary has been reviewed   and is understood.  Full responsibility of the confidentiality of this discharge information lies with you and/or your care-partner.    Handouts were given to your care partner on  Gastritis,polyps and hemorrhoids. You may resume your current medications today. Start taking PROTONIX 40 mg once daily in am.  It is best to take this med 20 - 30 minutes before breakfast. Await biopsy results. Please call if any questions or concerns.

## 2015-09-01 NOTE — Progress Notes (Signed)
To recovery, report to Willis, RN, VSS 

## 2015-09-04 ENCOUNTER — Telehealth: Payer: Self-pay

## 2015-09-04 NOTE — Telephone Encounter (Signed)
  Follow up Call-  Call back number 09/01/2015  Post procedure Call Back phone  # [phone]  Permission to leave phone message Yes    Patient was called for follow up after his procedure on 09/01/2015. I spoke with the patiens wife and she reports that Derek Browning has returned to his normal daily activities without difficulty.

## 2015-09-07 ENCOUNTER — Telehealth: Payer: Self-pay | Admitting: Gastroenterology

## 2015-09-07 NOTE — Telephone Encounter (Signed)
Spoke with patient's wife and told her results are not back yet.

## 2015-11-03 ENCOUNTER — Encounter: Payer: Self-pay | Admitting: *Deleted

## 2015-11-06 ENCOUNTER — Other Ambulatory Visit: Payer: Self-pay | Admitting: Gastroenterology

## 2015-11-09 ENCOUNTER — Encounter: Payer: Self-pay | Admitting: Gastroenterology

## 2015-11-09 ENCOUNTER — Ambulatory Visit (INDEPENDENT_AMBULATORY_CARE_PROVIDER_SITE_OTHER): Payer: Medicare Other | Admitting: Gastroenterology

## 2015-11-09 VITALS — BP 146/84 | HR 80 | Ht 65.75 in | Wt 164.4 lb

## 2015-11-09 DIAGNOSIS — K209 Esophagitis, unspecified without bleeding: Secondary | ICD-10-CM

## 2015-11-09 DIAGNOSIS — R142 Eructation: Secondary | ICD-10-CM | POA: Diagnosis not present

## 2015-11-09 DIAGNOSIS — K3189 Other diseases of stomach and duodenum: Secondary | ICD-10-CM

## 2015-11-09 DIAGNOSIS — Z86010 Personal history of colonic polyps: Secondary | ICD-10-CM | POA: Diagnosis not present

## 2015-11-09 DIAGNOSIS — K31A0 Gastric intestinal metaplasia, unspecified: Secondary | ICD-10-CM

## 2015-11-09 MED ORDER — PANTOPRAZOLE SODIUM 20 MG PO TBEC: 20.0000 mg | DELAYED_RELEASE_TABLET | Freq: Every day | ORAL | Status: DC

## 2015-11-09 NOTE — Progress Notes (Signed)
HPI :  INTAKE VISIT 65 y/o male seen in consultation for frequent belching and constipation.  He states his main complaint has been ongoing for 6-7 months and getting worse. He reports constant belching throughout the day, bothering him frequently. It does not make a difference what he eats. It occurs every day despite whether he eats or not. It doesn't bother him with sleep however and does not wake him. He does endorse some heartburn which bothers him. He has been given a trial of prilosec and prevacid as well. He thinks it helped his heartburn but did not help the belching at all. He does endorse ongoing dysphagia to solids, not to liquids. He sometimes needs to drink liquids to help push food down. He otherwise has some discomfort in his upper abdomen which comes and goes. He feels this discomfort can be improved with eating. He was given a trial of baclofen he thinks caused sedation and he did not like the way it made him feel. He has had a remote EGD in the 1980s, unsure of what this found. He is a former tobacco user.   He denies any changes in his bowels or blood in the stools. He has has daily stools, but passes hard stools at times which bothers him.   He has had a prior colonoscopy about 15 years ago, which was a normal exam per his report. No FH of CRC. No FH of esophageal or gastric cancer.   SINCE LAST VISIT:  EGD done showing some nonspecific gastritis, however biopsies are negative for H pylori. Biopsies did show some gastric intestinal metaplasia of the stomach but no dysplasia. He had esophagitis noted on EGD otherwise. I had recommended him to start protonix 40mg  daily for the next month or so. Otherwise for his screening he had 2 small adenomas removed, due for a repeat colonoscopy in 5 years.   He continues to have the belching. It continues to bother him most days. He can't predict when it will happen, some days better than others, frewquency can vary. No belching when  sleeping. No relation to eating. He has not tolerated a prior course of baclofen. He is an Engineer, manufacturing. He endorses significant ongoing life stressors including legal dispute about custody of his grandchildren and arrest of his sone. He has a court date later this month regarding the custody issue. He has a lot of anger and feels unable to help his son in this situation. Wife endorses significant life stressors. He denies depression / anxiety, no SI.  He has no FH of stomach cancer.   Dysphagia has otherwise resolved. He taking protonix which has resolved the dysphagia. He denies any GERD. No abdominal pains. Otherwise feels well outside of belching    Past Medical History  Diagnosis Date  . Sepsis ([place]) 2014    after surgery  . GERD (gastroesophageal reflux disease)   . Arthritis     Right foot  . Tubular adenoma of colon   . Gastritis     inactive  . Intestinal metaplasia of gastric mucosa      Past Surgical History  Procedure Laterality Date  . Appendectomy  1958  . Cervical disc surgery  1989    C5  . Orif ankle fracture Right 11/24/2013    Procedure: OPEN REDUCTION INTERNAL FIXATION (ORIF) RIGHT ANKLE;  Surgeon: [name], MD;  Location: [facility];  Service: Orthopedics;  Laterality: Right;  . Orif ankle fracture Right 02/18/2014    Procedure: SCREW  REMOVAL, REVISION OPEN REDUCTION INTERNAL FIXATION RIGHT  ANKLE;  Surgeon: [name], MD;  Location: [facility];  Service: Orthopedics;  Laterality: Right;  . Hardware removal Right 09/15/2014    Procedure: REMOVAL RETAINED HARDWARE RIGHT ANKLE;  Surgeon: Susa Day, MD;  Location: [facility];  Service: Orthopedics;  Laterality: Right;   History reviewed. No pertinent family history. Social History  Substance Use Topics  . Smoking status: Former Smoker -- 2.00 packs/day for 25 years    Types: Cigarettes    Quit date: 07/01/2000  . Smokeless tobacco: Never Used  . Alcohol Use: 0.0 oz/week    0 Standard drinks or equivalent  per week     Comment: occas beer   Current Outpatient Prescriptions  Medication Sig Dispense Refill  . pantoprazole (PROTONIX) 20 MG tablet Take 1 tablet (20 mg total) by mouth daily. 30 tablet 3   No current facility-administered medications for this visit.   Allergies  Allergen Reactions  . Cefepime Rash    No swelling or difficulty breathing     Review of Systems: All systems reviewed and negative except where noted in HPI.   Lab Results  Component Value Date   WBC 5.0 09/13/2014   HGB 15.7 09/13/2014   HCT 45.8 09/13/2014   MCV 89.8 09/13/2014   PLT 225 09/13/2014     Physical Exam: BP 146/84 mmHg  Pulse 80  Ht 5' 5.75" (1.67 m)  Wt 164 lb 6 oz (74.56 kg)  BMI 26.73 kg/m2 Constitutional: Pleasant,well-developed, male in no acute distress. HEENT: Normocephalic and atraumatic. Conjunctivae are normal. No scleral icterus. Neck supple.  Cardiovascular: Normal rate, regular rhythm.  Pulmonary/chest: Effort normal and breath sounds normal. No wheezing, rales or rhonchi. Abdominal: Soft, nondistended, nontender. Bowel sounds active throughout. There are no masses palpable. No hepatomegaly. Extremities: no edema Lymphadenopathy: No cervical adenopathy noted. Neurological: Alert and oriented to person place and time. Skin: Skin is warm and dry. No rashes noted. Psychiatric: Normal mood and affect. Behavior is normal.   ASSESSMENT AND PLAN: 65 y/o male with PMH as above, here for follow up for the following issues:  Belching - EGD without pathology for this. I suspect he has supragastric belching / aerophagia driving this symptom. I discussed with him in detail that this is almost always benign and a learned-behavior that he is doing himself. I previously told him to hold his tongue when he does this, and it does abort his belching. I suspect it may be related to his underlying significant life stressors. I offered him a referral to psychology or psychiatry to help him  deal with life stressors and possibly have biofeedback therapy done for the belching, but he declined at this time. He hopes with the upcoming court date some of these issues will be resolved. He has not tolerated prior course of baclofen. I reassured him this phenomenon is benign and he will see how things go moving forward. If he was a referral to mental health I will be happy to provide it to him.   Gastric intestinal metaplasia - incidentally noted on EGD. He has tested negative for H pylori and has no risk factors for gastric cancer. I discussed how this is theoretically a risk factor for gastric cancer, however in the [place] the risk of malignancy is extremely low, and per ASGE guidelines on this issue, no surveillance is recommended in the absence of risk factors for gastric cancer. He verbalized understanding. If he has postprandial or stomach symptoms  that bother him in the future he can follow up as needed.   Esophagitis - noted on EGD, treated with protonix, dysphagia resolved which may have been related. He can stop it at this time and use it PRN moving forward if symptoms recur or for GERD symptoms.   Adenomas - 2 noted on recent colonoscopy, recall colonoscopy to be done in 5 years.   [place] Cellar, MD [facility] Gastroenterology Pager [phone]

## 2015-11-09 NOTE — Patient Instructions (Signed)
We have sent the following medications to your pharmacy for you to pick up at your convenience: Pantoprazole 20 mg daily (in place of pantoprazole 40 mg daily)  You will be due for a recall colonoscopy in 08/2020. We will send you a reminder in the mail when it gets closer to that time.  If you are age 67 or older, your body mass index should be between 23-30. Your Body mass index is 26.73 kg/(m^2). If this is out of the aforementioned range listed, please consider follow up with your Primary Care Provider.  If you are age 44 or younger, your body mass index should be between 19-25. Your Body mass index is 26.73 kg/(m^2). If this is out of the aformentioned range listed, please consider follow up with your Primary Care Provider.

## 2016-08-20 ENCOUNTER — Telehealth: Payer: Self-pay | Admitting: Gastroenterology

## 2016-08-20 ENCOUNTER — Other Ambulatory Visit: Payer: Self-pay

## 2016-08-20 ENCOUNTER — Other Ambulatory Visit (INDEPENDENT_AMBULATORY_CARE_PROVIDER_SITE_OTHER): Payer: Medicare Other

## 2016-08-20 DIAGNOSIS — K625 Hemorrhage of anus and rectum: Secondary | ICD-10-CM | POA: Diagnosis not present

## 2016-08-20 LAB — CBC WITH DIFFERENTIAL/PLATELET
Basophils Absolute: 0 10*3/uL (ref 0.0–0.1)
Basophils Relative: 0.5 % (ref 0.0–3.0)
EOS PCT: 1.8 % (ref 0.0–5.0)
Eosinophils Absolute: 0.1 10*3/uL (ref 0.0–0.7)
HCT: 46.4 % (ref 39.0–52.0)
HEMOGLOBIN: 15.9 g/dL (ref 13.0–17.0)
LYMPHS PCT: 21.6 % (ref 12.0–46.0)
Lymphs Abs: 1.8 10*3/uL (ref 0.7–4.0)
MCHC: 34.3 g/dL (ref 30.0–36.0)
MCV: 90.7 fl (ref 78.0–100.0)
MONOS PCT: 5.2 % (ref 3.0–12.0)
Monocytes Absolute: 0.4 10*3/uL (ref 0.1–1.0)
Neutro Abs: 5.8 10*3/uL (ref 1.4–7.7)
Neutrophils Relative %: 70.9 % (ref 43.0–77.0)
Platelets: 229 10*3/uL (ref 150.0–400.0)
RBC: 5.11 Mil/uL (ref 4.22–5.81)
RDW: 13.4 % (ref 11.5–15.5)
WBC: 8.2 10*3/uL (ref 4.0–10.5)

## 2016-08-20 NOTE — Telephone Encounter (Signed)
Patient's wife does not know if he had fever, two days ago when this started. He will come in to get CBC today, she was very concerned about the continuation of the bleeding, so I have made him an appointment tomorrow morning with APP.

## 2016-08-20 NOTE — Telephone Encounter (Signed)
Any fevers by any chance, or symptoms of infection? If so he should have a GI pathogen panel to workup for infectious diarrhea. Otherwise if he has been constipation, could be overflow from constipation and rectal bleeding from hemorrhoids.  Would recommend CBC to ensure no significant blood loss. He should be seen in clinic if symptoms persist or worsen. Thanks

## 2016-08-20 NOTE — Telephone Encounter (Signed)
Patient's wife advised of lab results and to keep appointment tomorrow, go to ER if symptoms worsen.

## 2016-08-20 NOTE — Telephone Encounter (Signed)
Patient's wife called, reported that patient had bloody diarrhea yesterday and today. Denies any pain. Patient has history of constipation, had been constipated prior to this, history of internal hemorrhoids. Please advise.

## 2016-08-20 NOTE — Telephone Encounter (Signed)
Okay. Thanks Hgb checked this AM and is 15.9. I doubt he has had significant bleeding in this light, perhaps hemorrhoidal, and he can be seen in the clinic tomorrow. If he develops significant bleeding in the interim he would need to go to the ER for evaluation, however normal Hgb is reassuring at this time. Thanks

## 2016-08-21 ENCOUNTER — Encounter: Payer: Self-pay | Admitting: Physician Assistant

## 2016-08-21 ENCOUNTER — Ambulatory Visit (INDEPENDENT_AMBULATORY_CARE_PROVIDER_SITE_OTHER): Payer: Medicare Other | Admitting: Physician Assistant

## 2016-08-21 VITALS — BP 160/96 | HR 100 | Ht 65.75 in | Wt 162.0 lb

## 2016-08-21 DIAGNOSIS — R142 Eructation: Secondary | ICD-10-CM | POA: Diagnosis not present

## 2016-08-21 DIAGNOSIS — K625 Hemorrhage of anus and rectum: Secondary | ICD-10-CM

## 2016-08-21 DIAGNOSIS — R197 Diarrhea, unspecified: Secondary | ICD-10-CM

## 2016-08-21 MED ORDER — HYDROCORTISONE ACETATE 25 MG RE SUPP: RECTAL | 1 refills | Status: AC

## 2016-08-21 NOTE — Patient Instructions (Addendum)
If you are age 68 or older, your body mass index should be between 23-30. Your Body mass index is 26.35 kg/m. If this is out of the aforementioned range listed, please consider follow up with your Primary Care Provider.  If you are age 37 or younger, your body mass index should be between 19-25. Your Body mass index is 26.35 kg/m. If this is out of the aformentioned range listed, please consider follow up with your Primary Care Provider.   You have been scheduled for an abdominal ultrasound at [facility] (1st floor of hospital) on 08-29-2016 at 830 am. Please arrive 15 minutes prior to your appointment for registration. Make certain not to have anything to eat or drink 6 hours prior to your appointment. Should you need to reschedule your appointment, please contact radiology at [phone]. This test typically takes about 30 minutes to perform.  We have sent the following medications to your pharmacy for you to pick up at your convenience: Anusol  Start Phazyme 2 before meals  Continue Protonix  Thank you for choosing [facility]

## 2016-08-21 NOTE — Progress Notes (Signed)
Thanks for seeing Derek Browning.  Agree with assessment and plan. Will await his course on anusol and if bleeding persists can consider banding.  Of note, I had a lengthy discussion with him and wife about supragastric belching and aerophagia in May 2017 and offered him referred to behavioral health and for possible biofeedback therapy. We can still consider that if his symptoms persist.

## 2016-08-21 NOTE — Progress Notes (Signed)
Subjective:    Patient ID: Derek Browning, male    DOB: 10/26/1950, 66 y.o.   MRN: [id number]  HPI Tyriq is a pleasant 66 year old white male known to Dr. Havery Moros who comes in today with complaints of diarrhea and rectal bleeding. He was last seen in the office in May 2017 at that time with complaints of persistent belching which was felt secondary to aerophagia. He had undergone an EGD in March 2017 with finding of mild esophagitis and mild gastritis. Biopsies negative for H. pylori, he was noted to have gastric intestinal metaplasia. He has been on Protonix 40 mg by mouth daily. He also had colonoscopy in March 2017 with finding of 2 diminutive polyps in the ascending colon both of which were adenomas and also noted internal hemorrhoids. He is to have 5 year interval follow-up. Patient says his current symptoms started 2 days ago when he was awakened in the middle of the night with abdominal cramping and 1 or 2 episodes of diarrhea. He says he had some associated sweats but no fever or chills nausea or vomiting. He noticed with the second episode of diarrhea that there was some blood on the tissue but did not see any blood mixed in with the bowel movement. Yesterday he passed "a couple of clots" of red blood into the commode with straining but has not had any further diarrhea or bowel movement. He says he has had some churning and gassiness in his abdomen but is not really having any pain. He is eating lighter over the past couple of days. He has no complaints of rectal discomfort as never had previous rectal bleeding. He also mentions that he continues to have ongoing problems with burping and belching and His wife says is frequently starts early in the morning as soon as he gets up and therefore he is eaten anything. He feels the Protonix has helped her reflux type symptoms but has not helped the belching. He says this tends to come and go and does not seem to have any known triggers or pattern. He  finds it quite frustrating.  Review of Systems Pertinent positive and negative review of systems were noted in the above HPI section.  All other review of systems was otherwise negative.  Outpatient Encounter Prescriptions as of 08/21/2016  Medication Sig  . pantoprazole (PROTONIX) 20 MG tablet Take 1 tablet (20 mg total) by mouth daily.  . hydrocortisone (ANUSOL-HC) 25 MG suppository Use one at bedtime for 7 days and then as needed   No facility-administered encounter medications on file as of 08/21/2016.    Allergies  Allergen Reactions  . Cefepime Rash    No swelling or difficulty breathing   Patient Active Problem List   Diagnosis Date Noted  . Retained orthopedic hardware 09/15/2014  . Ankle fracture 02/18/2014  . Hyponatremia 12/11/2013  . Sepsis ([place]) 12/08/2013  . Sepsis secondary to UTI ([place]) 12/08/2013  . UTI (lower urinary tract infection) 12/08/2013  . Dehydration 12/08/2013  . Bimalleolar fracture of right ankle 11/24/2013   Social History   Social History  . Marital status: Married    Spouse name: N/A  . Number of children: 0  . Years of education: N/A   Occupational History  . self employed    Social History Main Topics  . Smoking status: Former Smoker    Packs/day: 2.00    Years: 25.00    Types: Cigarettes    Quit date: 07/01/2000  . Smokeless tobacco: Never Used  .  Alcohol use 0.0 oz/week     Comment: occas beer  . Drug use: No  . Sexual activity: Not on file   Other Topics Concern  . Not on file   Social History Narrative  . No narrative on file    Mr. Jacoby family history is not on file.      Objective:    Vitals:   08/21/16 0836  BP: (!) 160/96  Pulse: 100    Physical Exam well-developed older white male in no acute distress, accompanied by his wife blood pressure 160/96 pulse 100, height 5 foot 5, weight 162, BMI 26.3. HEENT; nontraumatic normocephalic EOMI PERRLA sclera anicteric, Cardiovascular ;regular rate and rhythm with  S1-S2 no murmur or gallop, Pulmonary; clear bilaterally Abdomen; soft nondistended nontender bowel sounds are present there is no palpable mass or hepatosplenomegaly, Rectal ;exam no external lesions noted, unable to do anoscopy as patient unable to relax, digital exam negative with no gross blood on the examining glove. Extremities; no clubbing cyanosis or edema skin warm and dry, Neuropsych; mood and affect appropriate       Assessment & Plan:   #55 66 year old white male with a brief episode of diarrhea 48 hours ago and mild abdominal cramping and 2 episodes of small-volume right red blood, one noted on the tissue and the other with drops of blood in the commode. I suspect he may have had a mild gastroenteritis and bleeding secondary to previously documented internal hemorrhoids #2 GERD #3 gastric intestinal metaplasia documented at EGD March 2017 #4 chronic belching-likely secondary to aerophagia though patient unaware. He and his wife both concerned  Plan; start Anusol HC suppositories daily at bedtime 7 days then as needed for recurrent bleeding. Briefly discussed possibility of an office hemorrhoidal banding should he develop ongoing issues with intermittent rectal bleeding. He is aware to call for follow-up appointment should he have increase in bleeding or persistent bleeding Continue Protonix 40 mg by mouth every morning Trial of Phazyme 1-2 with each meal to see if this will help decrease belching Check upper abdominal ultrasound, this is negative could also consider barium swallow as he also mentions that sometimes he feels as if his food sits in his chest.   Krist Rosenboom S Naika Noto PA-C 08/21/2016   Cc: No ref. provider found

## 2016-08-29 ENCOUNTER — Ambulatory Visit (HOSPITAL_COMMUNITY)
Admission: RE | Admit: 2016-08-29 | Discharge: 2016-08-29 | Disposition: A | Discharge status: Home / Self Care | Payer: Medicare Other | Source: Ambulatory Visit | Attending: Physician Assistant | Admitting: Physician Assistant

## 2016-08-29 ENCOUNTER — Telehealth: Payer: Self-pay | Admitting: Physician Assistant

## 2016-08-29 DIAGNOSIS — R197 Diarrhea, unspecified: Secondary | ICD-10-CM | POA: Diagnosis not present

## 2016-08-29 DIAGNOSIS — K8020 Calculus of gallbladder without cholecystitis without obstruction: Secondary | ICD-10-CM | POA: Insufficient documentation

## 2016-08-29 DIAGNOSIS — R142 Eructation: Secondary | ICD-10-CM | POA: Diagnosis not present

## 2016-08-29 DIAGNOSIS — K625 Hemorrhage of anus and rectum: Secondary | ICD-10-CM | POA: Diagnosis not present

## 2016-08-30 ENCOUNTER — Other Ambulatory Visit: Payer: Self-pay

## 2016-08-30 DIAGNOSIS — R079 Chest pain, unspecified: Secondary | ICD-10-CM

## 2016-08-30 DIAGNOSIS — R142 Eructation: Secondary | ICD-10-CM

## 2016-09-17 ENCOUNTER — Ambulatory Visit (HOSPITAL_COMMUNITY)
Admission: RE | Admit: 2016-09-17 | Discharge: 2016-09-17 | Disposition: A | Discharge status: Home / Self Care | Payer: Medicare Other | Source: Ambulatory Visit | Attending: Physician Assistant | Admitting: Physician Assistant

## 2016-09-17 DIAGNOSIS — R079 Chest pain, unspecified: Secondary | ICD-10-CM | POA: Diagnosis not present

## 2016-09-17 DIAGNOSIS — K209 Esophagitis, unspecified: Secondary | ICD-10-CM | POA: Diagnosis not present

## 2016-09-17 DIAGNOSIS — R142 Eructation: Secondary | ICD-10-CM | POA: Insufficient documentation

## 2016-09-17 DIAGNOSIS — K449 Diaphragmatic hernia without obstruction or gangrene: Secondary | ICD-10-CM | POA: Insufficient documentation

## 2016-12-03 ENCOUNTER — Other Ambulatory Visit: Payer: Self-pay | Admitting: Gastroenterology

## 2017-03-05 ENCOUNTER — Other Ambulatory Visit: Payer: Self-pay | Admitting: Gastroenterology

## 2017-03-05 NOTE — Telephone Encounter (Signed)
I have spoken with Dr Havery Moros to confirm that pantoprazole 20 mg dosing is okay for patient to take (he was originally to take 40 mg dosing). Rx refilled for pantoprazole 20 mg. Patient needs return office visit around 08/2017.

## 2017-06-12 ENCOUNTER — Emergency Department (HOSPITAL_COMMUNITY)
Admission: EM | Admit: 2017-06-12 | Discharge: 2017-06-12 | Disposition: A | Discharge status: Home / Self Care | Payer: Medicare Other | Attending: Emergency Medicine | Admitting: Emergency Medicine

## 2017-06-12 ENCOUNTER — Encounter (HOSPITAL_COMMUNITY): Payer: Self-pay | Admitting: Emergency Medicine

## 2017-06-12 ENCOUNTER — Other Ambulatory Visit: Payer: Self-pay

## 2017-06-12 DIAGNOSIS — R1013 Epigastric pain: Secondary | ICD-10-CM | POA: Diagnosis not present

## 2017-06-12 DIAGNOSIS — R142 Eructation: Secondary | ICD-10-CM | POA: Insufficient documentation

## 2017-06-12 DIAGNOSIS — Z79899 Other long term (current) drug therapy: Secondary | ICD-10-CM | POA: Insufficient documentation

## 2017-06-12 DIAGNOSIS — Z87891 Personal history of nicotine dependence: Secondary | ICD-10-CM | POA: Insufficient documentation

## 2017-06-12 LAB — CBC
HCT: 45.7 % (ref 39.0–52.0)
Hemoglobin: 15.9 g/dL (ref 13.0–17.0)
MCH: 32.1 pg (ref 26.0–34.0)
MCHC: 34.8 g/dL (ref 30.0–36.0)
MCV: 92.3 fL (ref 78.0–100.0)
Platelets: 204 10*3/uL (ref 150–400)
RBC: 4.95 MIL/uL (ref 4.22–5.81)
RDW: 12.9 % (ref 11.5–15.5)
WBC: 6.7 10*3/uL (ref 4.0–10.5)

## 2017-06-12 LAB — URINALYSIS, ROUTINE W REFLEX MICROSCOPIC
Bacteria, UA: NONE SEEN
Bilirubin Urine: NEGATIVE
Glucose, UA: 150 mg/dL — AB
Hgb urine dipstick: NEGATIVE
Ketones, ur: NEGATIVE mg/dL
Nitrite: NEGATIVE
Protein, ur: NEGATIVE mg/dL
Specific Gravity, Urine: 1.025 (ref 1.005–1.030)
pH: 5 (ref 5.0–8.0)

## 2017-06-12 LAB — COMPREHENSIVE METABOLIC PANEL
ALT: 61 U/L (ref 17–63)
AST: 42 U/L — ABNORMAL HIGH (ref 15–41)
Albumin: 4.6 g/dL (ref 3.5–5.0)
Alkaline Phosphatase: 46 U/L (ref 38–126)
Anion gap: 10 (ref 5–15)
BUN: 13 mg/dL (ref 6–20)
CO2: 25 mmol/L (ref 22–32)
Calcium: 9.8 mg/dL (ref 8.9–10.3)
Chloride: 101 mmol/L (ref 101–111)
Creatinine, Ser: 1.31 mg/dL — ABNORMAL HIGH (ref 0.61–1.24)
GFR calc Af Amer: >60 mL/min (ref >60)
GFR calc non Af Amer: 54 mL/min — ABNORMAL LOW (ref >60)
Glucose, Bld: 228 mg/dL — ABNORMAL HIGH (ref 65–99)
Potassium: 4.6 mmol/L (ref 3.5–5.1)
Sodium: 136 mmol/L (ref 135–145)
Total Bilirubin: 1.1 mg/dL (ref 0.3–1.2)
Total Protein: 8.1 g/dL (ref 6.5–8.1)

## 2017-06-12 LAB — LIPASE, BLOOD: Lipase: 34 U/L (ref 11–51)

## 2017-06-12 MED ORDER — LORAZEPAM 0.5 MG PO TABS
1.5000 mg | ORAL_TABLET | Freq: Once | ORAL | Status: AC
  Administered 2017-06-12: 23:00:00 1.5 mg via ORAL
  Filled 2017-06-12: qty 1

## 2017-06-12 MED ORDER — HYDROCODONE-ACETAMINOPHEN 5-325 MG PO TABS: 2.0000 | ORAL_TABLET | Freq: Four times a day (QID) | ORAL | 0 refills | Status: DC | PRN

## 2017-06-12 MED ORDER — GI COCKTAIL ~~LOC~~
30.0000 mL | Freq: Once | ORAL | Status: AC
  Administered 2017-06-12: 30 mL via ORAL
  Filled 2017-06-12: qty 30

## 2017-06-12 NOTE — Discharge Instructions (Signed)
Follow-up with [facility]. Your abdominal pain may be related to gallstones.  Your blood pressure was high on your visit today. Use your blood pressure cuff at home and keep a log to take with you to your appointment with your PCP in January. You glucose (blood sugar) was also elevated in the 200s. This is high even for non-fasting blood work. This is something that also needs to be addressed or at least have repeat blood work in the next few weeks. Take prescribed pain medication as prescribed.

## 2017-06-12 NOTE — ED Provider Notes (Signed)
[facility] DEPT Provider Note   CSN: [id number] Arrival date & time: 06/12/17  2044     History   Chief Complaint Chief Complaint  Patient presents with  . Abdominal Pain  . Hypertension    HPI Derek Browning is a 66 y.o. male.  HPI   66 year old male with epigastric pain.  Worsening over the past 2 days.  Reports the pain is constant.  No appreciable exacerbating relieving factors.  Nausea and anorexia.  No vomiting.  Frequent burping, but he states that this is a chronic issue for him.  Today he has had shaking which she feels like he cannot control.  No fevers.  He does not feel chilled.  He does drink beer every day but has not had anything to drink yet today.  He does not appear to have a diagnosed history of hypertension and takes no antihypertensive medications.  Past Medical History:  Diagnosis Date  . Arthritis    Right foot  . Gastritis    inactive  . GERD (gastroesophageal reflux disease)   . Intestinal metaplasia of gastric mucosa   . Sepsis ([place]) 2014   after surgery  . Tubular adenoma of colon     Patient Active Problem List   Diagnosis Date Noted  . Retained orthopedic hardware 09/15/2014  . Ankle fracture 02/18/2014  . Hyponatremia 12/11/2013  . Sepsis ([place]) 12/08/2013  . Sepsis secondary to UTI ([place]) 12/08/2013  . UTI (lower urinary tract infection) 12/08/2013  . Dehydration 12/08/2013  . Bimalleolar fracture of right ankle 11/24/2013    Past Surgical History:  Procedure Laterality Date  . APPENDECTOMY  1958  . CERVICAL DISC SURGERY  1989   C5  . HARDWARE REMOVAL Right 09/15/2014   Procedure: REMOVAL RETAINED HARDWARE RIGHT ANKLE;  Surgeon: Susa Day, MD;  Location: [facility];  Service: Orthopedics;  Laterality: Right;  . ORIF ANKLE FRACTURE Right 11/24/2013   Procedure: OPEN REDUCTION INTERNAL FIXATION (ORIF) RIGHT ANKLE;  Surgeon: [name], MD;  Location: [facility];  Service: Orthopedics;  Laterality: Right;    . ORIF ANKLE FRACTURE Right 02/18/2014   Procedure: SCREW REMOVAL, REVISION OPEN REDUCTION INTERNAL FIXATION RIGHT  ANKLE;  Surgeon: [name], MD;  Location: [facility];  Service: Orthopedics;  Laterality: Right;       Home Medications    Prior to Admission medications   Medication Sig Start Date End Date Taking? Authorizing Provider  pantoprazole (PROTONIX) 20 MG tablet TAKE 1 TABLET BY MOUTH DAILY 03/05/17  Yes Armbruster, Carlota Raspberry, MD  hydrocortisone (ANUSOL-HC) 25 MG suppository Use one at bedtime for 7 days and then as needed Patient not taking: Reported on 06/12/2017 08/21/16   Alfredia Ferguson, PA-C    Family History No family history on file.  Social History Social History   Tobacco Use  . Smoking status: Former Smoker    Packs/day: 2.00    Years: 25.00    Pack years: 50.00    Types: Cigarettes    Last attempt to quit: 07/01/2000    Years since quitting: 16.9  . Smokeless tobacco: Never Used  Substance Use Topics  . Alcohol use: Yes    Alcohol/week: 0.0 oz    Comment: occas beer  . Drug use: No     Allergies   Cefepime   Review of Systems Review of Systems  All systems reviewed and negative, other than as noted in HPI.  Physical Exam Updated Vital Signs BP (!) 204/93 (BP Location:  Left Arm)   Pulse 82   Temp 98.4 F (36.9 C) (Oral)   Resp 20   Ht 5\' 6"  (1.676 m)   Wt 72.6 kg (160 lb)   SpO2 95%   BMI 25.82 kg/m   Physical Exam  Constitutional: He appears well-developed and well-nourished. No distress.  Laying in bed.  Mildly tremulous.  Does not seem distressed.  HENT:  Head: Normocephalic and atraumatic.  Eyes: Conjunctivae are normal. Right eye exhibits no discharge. Left eye exhibits no discharge.  Neck: Neck supple.  Cardiovascular: Normal rate, regular rhythm and normal heart sounds. Exam reveals no gallop and no friction rub.  No murmur heard. Pulmonary/Chest: Effort normal and breath sounds normal. No respiratory distress.   Abdominal: Soft. He exhibits no distension.  Frequent burping.  Tenderness in the epigastrium without rebound or guarding.  Musculoskeletal: He exhibits no edema or tenderness.  Neurological: He is alert.  Skin: Skin is warm and dry.  Psychiatric: He has a normal mood and affect. His behavior is normal. Thought content normal.  Nursing note and vitals reviewed.    ED Treatments / Results  Labs (all labs ordered are listed, but only abnormal results are displayed) Labs Reviewed  COMPREHENSIVE METABOLIC PANEL - Abnormal; Notable for the following components:      Result Value   Glucose, Bld 228 (*)    Creatinine, Ser 1.31 (*)    AST 42 (*)    GFR calc non Af Amer 54 (*)    All other components within normal limits  URINALYSIS, ROUTINE W REFLEX MICROSCOPIC - Abnormal; Notable for the following components:   Glucose, UA 150 (*)    Leukocytes, UA TRACE (*)    Squamous Epithelial / LPF 0-5 (*)    All other components within normal limits  LIPASE, BLOOD  CBC    EKG  EKG Interpretation None       Radiology No results found.  Procedures Procedures (including critical care time)  Medications Ordered in ED Medications - No data to display   Initial Impression / Assessment and Plan / ED Course  I have reviewed the triage vital signs and the nursing notes.  Pertinent labs & imaging results that were available during my care of the patient were reviewed by me and considered in my medical decision making (see chart for details).    66 year old male with upper abdominal pain.  Possibly PUD versus gastritis particularly with alcohol use versus symptomatic cholelithiasis.  Lipase is normal.  Prior imaging was reviewed today including ultrasounds.  Patient was unaware of results the ultrasound from March of this year.  Advised to follow-up with general surgery as his symptoms may be related to this.  He is tachycardic and hypertensive.  Suspect he may be withdrawing from alcohol.   His mentation seems fine though. Final Clinical Impressions(s) / ED Diagnoses   Final diagnoses:  Epigastric pain  Burping    ED Discharge Orders    None       Virgel Manifold, MD 06/27/17 [phone]

## 2017-06-12 NOTE — ED Triage Notes (Signed)
Pt c/o upper abd pain x 2 days.  States that he keeps burping.  Pt also states that he cannot stop shaking. Pt asked about alcohol use.  States that he drinks a couple of beers everyday "but nothing serious".  Pt states that he has not drank today.  Pt hypertensive in triage at 209/95.

## 2017-06-27 DIAGNOSIS — K209 Esophagitis, unspecified: Secondary | ICD-10-CM | POA: Diagnosis not present

## 2017-06-27 DIAGNOSIS — R1310 Dysphagia, unspecified: Secondary | ICD-10-CM | POA: Diagnosis not present

## 2017-06-27 DIAGNOSIS — K8010 Calculus of gallbladder with chronic cholecystitis without obstruction: Secondary | ICD-10-CM | POA: Diagnosis not present

## 2017-06-30 ENCOUNTER — Encounter (HOSPITAL_BASED_OUTPATIENT_CLINIC_OR_DEPARTMENT_OTHER): Payer: Self-pay | Admitting: *Deleted

## 2017-07-02 ENCOUNTER — Encounter (HOSPITAL_BASED_OUTPATIENT_CLINIC_OR_DEPARTMENT_OTHER): Payer: Self-pay | Admitting: *Deleted

## 2017-07-02 ENCOUNTER — Ambulatory Visit: Payer: Self-pay | Admitting: General Surgery

## 2017-07-03 ENCOUNTER — Encounter (HOSPITAL_BASED_OUTPATIENT_CLINIC_OR_DEPARTMENT_OTHER): Payer: Self-pay

## 2017-07-03 ENCOUNTER — Other Ambulatory Visit: Payer: Self-pay

## 2017-07-03 NOTE — Progress Notes (Signed)
Spoke with: Nevin Bloodgood (Mr. Winer wife) NPO: No food after midnight, clear liquids until 8am DOS, no gum, candy, or mints Arrival time:  12:15PM Labs:  CBC/diff and CMET drawn 07/11/17@8am  AM medications:  Pantoprazole, Ensure, Hibiclens HS& AM DOS Pre op orders: Yes Ride home: Nevin Bloodgood (wife) [phone]

## 2017-07-03 NOTE — Patient Instructions (Signed)
Cleon Signorelli  07/03/2017     Your procedure is scheduled on Wednesday, Jan. 16, 2019   Report to [facility]  At  12:15 P.M.   Call this number if you have problems the morning of surgery:[phone]              OUR ADDRESS IS [address] , WE ARE LOCATED IN [place].    Remember:  Do not eat food after midnight.   May have clear liquids until 8am Wednesday morning:  CLEAR LIQUID DIET Foods Allowed                                                                     Foods Excluded  Coffee and tea, regular and decaf                             liquids that you cannot  Plain Jell-O in any flavor                                             see through such as: Fruit ices (not with fruit pulp)                                     milk, soups, orange juice  Iced Popsicles                                    All solid food Carbonated beverages, regular and diet                                    Cranberry, grape and apple juices Sports drinks like Gatorade Lightly seasoned clear broth or consume(fat free) Sugar, honey syrup  Sample Menu Breakfast                                Lunch                                     Supper Cranberry juice                    Beef broth                            Chicken broth Jell-O                                     Grape juice  Apple juice Coffee or tea                        Jell-O                                      Popsicle                                                Coffee or tea                        Coffee or tea     Take these medicines the morning of surgery with A SIP OF WATER: Pantoprazole   Complete one Ensure drink the morning of surgery 3 hours prior to scheduled surgery, by 11:00AM   Do not wear jewelry or metal.  Do not wear lotions, powders, cologne, or deoderant.  Men may shave face and neck.  Do not bring valuables to the  hospital.  [facility] is not responsible for any belongings or valuables.  Contacts, dentures or bridgework may not be worn into surgery.  Leave your suitcase in the car.  After surgery it may be brought to your room.  For patients admitted to the hospital, discharge time will be determined by your treatment team.  Please read over the following fact sheets that you were given.    [facility] - Preparing for Surgery Before surgery, you can play an important role.  Because skin is not sterile, your skin needs to be as free of germs as possible.  You can reduce the number of germs on your skin by washing with CHG (chlorahexidine gluconate) soap before surgery.  CHG is an antiseptic cleaner which kills germs and bonds with the skin to continue killing germs even after washing. Please DO NOT use if you have an allergy to CHG or antibacterial soaps.  If your skin becomes reddened/irritated stop using the CHG and inform your nurse when you arrive at Short Stay. Do not shave (including legs and underarms) for at least 48 hours prior to the first CHG shower.  You may shave your face/neck.  Please follow these instructions carefully:  1.  Shower with CHG Soap the night before surgery and the  morning of surgery.  2.  If you choose to wash your hair, wash your hair first as usual with your normal  shampoo.  3.  After you shampoo, rinse your hair and body thoroughly to remove the shampoo.                             4.  Use CHG as you would any other liquid soap.  You can apply chg directly to the skin and wash.  Gently with a scrungie or clean washcloth.  5.  Apply the CHG Soap to your body ONLY FROM THE NECK DOWN.   Do   not use on face/ open                           Wound or open sores. Avoid contact with eyes, ears mouth and   genitals (private parts).  Wash face,  Genitals (private parts) with your normal soap.             6.  Wash thoroughly, paying special attention to the  area where your    surgery  will be performed.  7.  Thoroughly rinse your body with warm water from the neck down.  8.  DO NOT shower/wash with your normal soap after using and rinsing off the CHG Soap.                9.  Pat yourself dry with a clean towel.            10.  Wear clean pajamas.            11.  Place clean sheets on your bed the night of your first shower and do not  sleep with pets. Day of Surgery : Do not apply any lotions/deodorants the morning of surgery.  Please wear clean clothes to the hospital/surgery center.  FAILURE TO FOLLOW THESE INSTRUCTIONS MAY RESULT IN THE CANCELLATION OF YOUR SURGERY  PATIENT SIGNATURE_________________________________  NURSE SIGNATURE__________________________________  ________________________________________________________________________

## 2017-07-11 ENCOUNTER — Encounter (HOSPITAL_COMMUNITY)
Admission: RE | Admit: 2017-07-11 | Discharge: 2017-07-11 | Disposition: A | Discharge status: Home / Self Care | Payer: Medicare HMO | Source: Ambulatory Visit | Attending: General Surgery | Admitting: General Surgery

## 2017-07-11 DIAGNOSIS — K819 Cholecystitis, unspecified: Secondary | ICD-10-CM | POA: Insufficient documentation

## 2017-07-11 LAB — COMPREHENSIVE METABOLIC PANEL
ALT: 58 U/L (ref 17–63)
AST: 44 U/L — ABNORMAL HIGH (ref 15–41)
Albumin: 4 g/dL (ref 3.5–5.0)
Alkaline Phosphatase: 38 U/L (ref 38–126)
Anion gap: 7 (ref 5–15)
BUN: 12 mg/dL (ref 6–20)
CALCIUM: 9.4 mg/dL (ref 8.9–10.3)
CHLORIDE: 103 mmol/L (ref 101–111)
CO2: 26 mmol/L (ref 22–32)
CREATININE: 1.22 mg/dL (ref 0.61–1.24)
GFR, EST NON AFRICAN AMERICAN: 59 mL/min — AB (ref >60)
Glucose, Bld: 136 mg/dL — ABNORMAL HIGH (ref 65–99)
Potassium: 5.3 mmol/L — ABNORMAL HIGH (ref 3.5–5.1)
SODIUM: 136 mmol/L (ref 135–145)
Total Bilirubin: 1.2 mg/dL (ref 0.3–1.2)
Total Protein: 7.6 g/dL (ref 6.5–8.1)

## 2017-07-11 LAB — CBC WITH DIFFERENTIAL/PLATELET
Basophils Absolute: 0 10*3/uL (ref 0.0–0.1)
Basophils Relative: 0 %
EOS ABS: 0.5 10*3/uL (ref 0.0–0.7)
EOS PCT: 10 %
HCT: 47.7 % (ref 39.0–52.0)
Hemoglobin: 16.2 g/dL (ref 13.0–17.0)
LYMPHS ABS: 1.4 10*3/uL (ref 0.7–4.0)
LYMPHS PCT: 29 %
MCH: 31.3 pg (ref 26.0–34.0)
MCHC: 34 g/dL (ref 30.0–36.0)
MCV: 92.3 fL (ref 78.0–100.0)
MONO ABS: 0.3 10*3/uL (ref 0.1–1.0)
MONOS PCT: 7 %
Neutro Abs: 2.7 10*3/uL (ref 1.7–7.7)
Neutrophils Relative %: 54 %
PLATELETS: 214 10*3/uL (ref 150–400)
RBC: 5.17 MIL/uL (ref 4.22–5.81)
RDW: 12.9 % (ref 11.5–15.5)
WBC: 4.9 10*3/uL (ref 4.0–10.5)

## 2017-07-15 NOTE — Progress Notes (Signed)
Called to confirm time change for procedure. Instructed to arrive at 0930 and remain NPO after midnight except for reflux med and Ensure.

## 2017-07-16 ENCOUNTER — Encounter (HOSPITAL_BASED_OUTPATIENT_CLINIC_OR_DEPARTMENT_OTHER): Payer: Self-pay

## 2017-07-16 ENCOUNTER — Ambulatory Visit (HOSPITAL_BASED_OUTPATIENT_CLINIC_OR_DEPARTMENT_OTHER)
Admission: RE | Admit: 2017-07-16 | Discharge: 2017-07-16 | Disposition: A | Discharge status: Home / Self Care | Payer: Medicare HMO | Source: Ambulatory Visit | Attending: General Surgery | Admitting: General Surgery

## 2017-07-16 ENCOUNTER — Encounter (HOSPITAL_BASED_OUTPATIENT_CLINIC_OR_DEPARTMENT_OTHER)
Admission: RE | Disposition: A | Discharge status: Home / Self Care | Payer: Self-pay | Source: Ambulatory Visit | Attending: General Surgery

## 2017-07-16 ENCOUNTER — Ambulatory Visit (HOSPITAL_BASED_OUTPATIENT_CLINIC_OR_DEPARTMENT_OTHER): Payer: Medicare HMO | Admitting: Anesthesiology

## 2017-07-16 DIAGNOSIS — Z87891 Personal history of nicotine dependence: Secondary | ICD-10-CM | POA: Diagnosis not present

## 2017-07-16 DIAGNOSIS — K8010 Calculus of gallbladder with chronic cholecystitis without obstruction: Secondary | ICD-10-CM | POA: Insufficient documentation

## 2017-07-16 DIAGNOSIS — K449 Diaphragmatic hernia without obstruction or gangrene: Secondary | ICD-10-CM | POA: Diagnosis not present

## 2017-07-16 DIAGNOSIS — K219 Gastro-esophageal reflux disease without esophagitis: Secondary | ICD-10-CM | POA: Diagnosis not present

## 2017-07-16 DIAGNOSIS — Z79899 Other long term (current) drug therapy: Secondary | ICD-10-CM | POA: Diagnosis not present

## 2017-07-16 DIAGNOSIS — Z87440 Personal history of urinary (tract) infections: Secondary | ICD-10-CM | POA: Diagnosis not present

## 2017-07-16 HISTORY — DX: Personal history of other infectious and parasitic diseases: Z86.19

## 2017-07-16 HISTORY — DX: Diaphragmatic hernia without obstruction or gangrene: K44.9

## 2017-07-16 HISTORY — DX: Personal history of urinary (tract) infections: Z87.440

## 2017-07-16 HISTORY — DX: Personal history of colonic polyps: Z86.010

## 2017-07-16 HISTORY — DX: Personal history of other diseases of the digestive system: Z87.19

## 2017-07-16 HISTORY — PX: CHOLECYSTECTOMY: SHX55

## 2017-07-16 HISTORY — DX: Personal history of adenomatous and serrated colon polyps: Z86.0101

## 2017-07-16 HISTORY — DX: Calculus of gallbladder without cholecystitis without obstruction: K80.20

## 2017-07-16 SURGERY — LAPAROSCOPIC CHOLECYSTECTOMY
Anesthesia: General | Site: Abdomen

## 2017-07-16 MED ORDER — CIPROFLOXACIN IN D5W 400 MG/200ML IV SOLN
400.0000 mg | INTRAVENOUS | Status: AC
  Administered 2017-07-16: 400 mg via INTRAVENOUS
  Filled 2017-07-16: qty 200

## 2017-07-16 MED ORDER — LIDOCAINE 2% (20 MG/ML) 5 ML SYRINGE
INTRAMUSCULAR | Status: AC
  Filled 2017-07-16: qty 5

## 2017-07-16 MED ORDER — ACETAMINOPHEN 500 MG PO TABS
ORAL_TABLET | ORAL | Status: AC
  Filled 2017-07-16: qty 2

## 2017-07-16 MED ORDER — ONDANSETRON HCL 4 MG/2ML IJ SOLN
INTRAMUSCULAR | Status: AC
  Filled 2017-07-16: qty 2

## 2017-07-16 MED ORDER — DEXAMETHASONE SODIUM PHOSPHATE 4 MG/ML IJ SOLN
INTRAMUSCULAR | Status: DC | PRN
  Administered 2017-07-16: 10 mg via INTRAVENOUS

## 2017-07-16 MED ORDER — FENTANYL CITRATE (PF) 100 MCG/2ML IJ SOLN
INTRAMUSCULAR | Status: AC
  Filled 2017-07-16: qty 2

## 2017-07-16 MED ORDER — ROCURONIUM BROMIDE 100 MG/10ML IV SOLN
INTRAVENOUS | Status: DC | PRN
  Administered 2017-07-16: 30 mg via INTRAVENOUS

## 2017-07-16 MED ORDER — GABAPENTIN 300 MG PO CAPS
ORAL_CAPSULE | ORAL | Status: AC
  Filled 2017-07-16: qty 1

## 2017-07-16 MED ORDER — OXYCODONE HCL 5 MG PO TABS
5.0000 mg | ORAL_TABLET | Freq: Once | ORAL | Status: AC | PRN
  Administered 2017-07-16: 5 mg via ORAL
  Filled 2017-07-16: qty 1

## 2017-07-16 MED ORDER — CHLORHEXIDINE GLUCONATE CLOTH 2 % EX PADS
6.0000 | MEDICATED_PAD | Freq: Once | CUTANEOUS | Status: DC
  Filled 2017-07-16: qty 6

## 2017-07-16 MED ORDER — GENTAMICIN SULFATE 40 MG/ML IJ SOLN
5.0000 mg/kg | INTRAVENOUS | Status: AC
  Administered 2017-07-16: 370 mg via INTRAVENOUS
  Filled 2017-07-16: qty 9.25

## 2017-07-16 MED ORDER — PROPOFOL 10 MG/ML IV BOLUS
INTRAVENOUS | Status: DC | PRN
  Administered 2017-07-16: 150 mg via INTRAVENOUS

## 2017-07-16 MED ORDER — KETOROLAC TROMETHAMINE 30 MG/ML IJ SOLN
30.0000 mg | Freq: Once | INTRAMUSCULAR | Status: DC | PRN
  Filled 2017-07-16: qty 1

## 2017-07-16 MED ORDER — ACETAMINOPHEN 500 MG PO TABS
1000.0000 mg | ORAL_TABLET | ORAL | Status: AC
  Administered 2017-07-16: 1000 mg via ORAL
  Filled 2017-07-16: qty 2

## 2017-07-16 MED ORDER — PROPOFOL 10 MG/ML IV BOLUS
INTRAVENOUS | Status: AC
  Filled 2017-07-16: qty 40

## 2017-07-16 MED ORDER — IBUPROFEN 800 MG PO TABS: 800.0000 mg | ORAL_TABLET | Freq: Three times a day (TID) | ORAL | 0 refills | Status: AC | PRN

## 2017-07-16 MED ORDER — ACETAMINOPHEN 325 MG PO TABS
325.0000 mg | ORAL_TABLET | ORAL | Status: DC | PRN
  Filled 2017-07-16: qty 2

## 2017-07-16 MED ORDER — ROCURONIUM BROMIDE 50 MG/5ML IV SOSY
PREFILLED_SYRINGE | INTRAVENOUS | Status: AC
  Filled 2017-07-16: qty 5

## 2017-07-16 MED ORDER — CIPROFLOXACIN IN D5W 400 MG/200ML IV SOLN
INTRAVENOUS | Status: AC
  Filled 2017-07-16: qty 200

## 2017-07-16 MED ORDER — SUGAMMADEX SODIUM 200 MG/2ML IV SOLN
INTRAVENOUS | Status: DC | PRN
  Administered 2017-07-16: 200 mg via INTRAVENOUS

## 2017-07-16 MED ORDER — HYDROCODONE-ACETAMINOPHEN 5-325 MG PO TABS: 1.0000 | ORAL_TABLET | Freq: Four times a day (QID) | ORAL | 0 refills | Status: AC | PRN

## 2017-07-16 MED ORDER — FENTANYL CITRATE (PF) 100 MCG/2ML IJ SOLN
25.0000 ug | INTRAMUSCULAR | Status: DC | PRN
  Filled 2017-07-16: qty 1

## 2017-07-16 MED ORDER — KETOROLAC TROMETHAMINE 30 MG/ML IJ SOLN
INTRAMUSCULAR | Status: DC | PRN
  Administered 2017-07-16: 30 mg via INTRAVENOUS

## 2017-07-16 MED ORDER — ONDANSETRON HCL 4 MG/2ML IJ SOLN
INTRAMUSCULAR | Status: DC | PRN
  Administered 2017-07-16: 4 mg via INTRAVENOUS

## 2017-07-16 MED ORDER — GABAPENTIN 300 MG PO CAPS
300.0000 mg | ORAL_CAPSULE | ORAL | Status: AC
  Administered 2017-07-16: 300 mg via ORAL
  Filled 2017-07-16: qty 1

## 2017-07-16 MED ORDER — LACTATED RINGERS IV SOLN
INTRAVENOUS | Status: DC
  Administered 2017-07-16 (×2): via INTRAVENOUS
  Filled 2017-07-16: qty 1000

## 2017-07-16 MED ORDER — ACETAMINOPHEN 160 MG/5ML PO SOLN
325.0000 mg | ORAL | Status: DC | PRN
  Filled 2017-07-16: qty 20.3

## 2017-07-16 MED ORDER — ONDANSETRON HCL 4 MG/2ML IJ SOLN
4.0000 mg | Freq: Once | INTRAMUSCULAR | Status: DC | PRN
  Filled 2017-07-16: qty 2

## 2017-07-16 MED ORDER — OXYCODONE HCL 5 MG/5ML PO SOLN
5.0000 mg | Freq: Once | ORAL | Status: AC | PRN
  Filled 2017-07-16: qty 5

## 2017-07-16 MED ORDER — DEXAMETHASONE SODIUM PHOSPHATE 10 MG/ML IJ SOLN
INTRAMUSCULAR | Status: AC
  Filled 2017-07-16: qty 1

## 2017-07-16 MED ORDER — OXYCODONE HCL 5 MG PO TABS
ORAL_TABLET | ORAL | Status: AC
  Filled 2017-07-16: qty 1

## 2017-07-16 MED ORDER — FENTANYL CITRATE (PF) 100 MCG/2ML IJ SOLN
INTRAMUSCULAR | Status: DC | PRN
  Administered 2017-07-16 (×4): 25 ug via INTRAVENOUS

## 2017-07-16 MED ORDER — MEPERIDINE HCL 25 MG/ML IJ SOLN
6.2500 mg | INTRAMUSCULAR | Status: DC | PRN
  Filled 2017-07-16: qty 1

## 2017-07-16 MED ORDER — LIDOCAINE HCL (CARDIAC) 20 MG/ML IV SOLN
INTRAVENOUS | Status: DC | PRN
  Administered 2017-07-16: 100 mg via INTRAVENOUS

## 2017-07-16 SURGICAL SUPPLY — 53 items
APPLIER CLIP ROT 10 11.4 M/L (STAPLE)
BLADE SURG 11 STRL SS (BLADE) ×3 IMPLANT
CABLE HIGH FREQUENCY MONO STRZ (ELECTRODE) ×3 IMPLANT
CATH CHOLANG 76X19 KUMAR (CATHETERS) IMPLANT
CHLORAPREP W/TINT 26ML (MISCELLANEOUS) ×3 IMPLANT
CLIP APPLIE ROT 10 11.4 M/L (STAPLE) IMPLANT
CLIP LIGATING HEM O LOK PURPLE (MISCELLANEOUS) ×3 IMPLANT
CLIP LIGATING HEMO LOK XL GOLD (MISCELLANEOUS) IMPLANT
COVER BACK TABLE 60X90IN (DRAPES) ×3 IMPLANT
COVER MAYO STAND STRL (DRAPES) ×3 IMPLANT
DECANTER SPIKE VIAL GLASS SM (MISCELLANEOUS) ×3 IMPLANT
DERMABOND ADVANCED (GAUZE/BANDAGES/DRESSINGS) ×2
DERMABOND ADVANCED .7 DNX12 (GAUZE/BANDAGES/DRESSINGS) ×1 IMPLANT
DEVICE PMI PUNCTURE CLOSURE (MISCELLANEOUS) IMPLANT
DRAIN CHANNEL 19F RND (DRAIN) IMPLANT
DRAPE C-ARM 42X72 X-RAY (DRAPES) IMPLANT
DRAPE LAPAROSCOPIC ABDOMINAL (DRAPES) ×3 IMPLANT
DRAPE UTILITY XL STRL (DRAPES) ×3 IMPLANT
ELECT REM PT RETURN 9FT ADLT (ELECTROSURGICAL) ×3
ELECTRODE REM PT RTRN 9FT ADLT (ELECTROSURGICAL) ×1 IMPLANT
EVACUATOR SILICONE 100CC (DRAIN) IMPLANT
GLOVE BIO SURGEON STRL SZ 6.5 (GLOVE) ×2 IMPLANT
GLOVE BIO SURGEONS STRL SZ 6.5 (GLOVE) ×1
GLOVE BIOGEL PI IND STRL 6.5 (GLOVE) ×4 IMPLANT
GLOVE BIOGEL PI IND STRL 7.0 (GLOVE) ×1 IMPLANT
GLOVE BIOGEL PI IND STRL 7.5 (GLOVE) ×1 IMPLANT
GLOVE BIOGEL PI INDICATOR 6.5 (GLOVE) ×8
GLOVE BIOGEL PI INDICATOR 7.0 (GLOVE) ×2
GLOVE BIOGEL PI INDICATOR 7.5 (GLOVE) ×2
GLOVE SURG SS PI 7.0 STRL IVOR (GLOVE) ×3 IMPLANT
GOWN STRL REUS W/TWL LRG LVL3 (GOWN DISPOSABLE) ×12 IMPLANT
HEMOSTAT SNOW SURGICEL 2X4 (HEMOSTASIS) IMPLANT
IRRIG SUCT STRYKERFLOW 2 WTIP (MISCELLANEOUS) ×3
IRRIGATION SUCT STRKRFLW 2 WTP (MISCELLANEOUS) ×1 IMPLANT
NEEDLE HYPO 22GX1.5 SAFETY (NEEDLE) ×3 IMPLANT
NS IRRIG 500ML POUR BTL (IV SOLUTION) ×3 IMPLANT
PACK BASIN DAY SURGERY FS (CUSTOM PROCEDURE TRAY) ×3 IMPLANT
POUCH RETRIEVAL ECOSAC 10 (ENDOMECHANICALS) ×1 IMPLANT
POUCH RETRIEVAL ECOSAC 10MM (ENDOMECHANICALS) ×2
SCISSORS LAP 5X35 DISP (ENDOMECHANICALS) ×3 IMPLANT
SHEARS HARMONIC ACE PLUS 36CM (ENDOMECHANICALS) IMPLANT
SOLUTION ANTI FOG 6CC (MISCELLANEOUS) ×3 IMPLANT
STOPCOCK 4 WAY LG BORE MALE ST (IV SETS) IMPLANT
SUT ETHILON 2 0 PS N (SUTURE) IMPLANT
SUT MNCRL AB 4-0 PS2 18 (SUTURE) ×3 IMPLANT
SUT VICRYL 0 ENDOLOOP (SUTURE) IMPLANT
SUT VICRYL 0 UR6 27IN ABS (SUTURE) IMPLANT
SYR 20CC LL (SYRINGE) IMPLANT
SYR CONTROL 10ML LL (SYRINGE) ×3 IMPLANT
TOWEL OR 17X24 6PK STRL BLUE (TOWEL DISPOSABLE) ×6 IMPLANT
TROCAR BLADELESS OPT 5 100 (ENDOMECHANICALS) ×9 IMPLANT
TROCAR XCEL 12X100 BLDLESS (ENDOMECHANICALS) ×3 IMPLANT
TUBING INSUF HEATED (TUBING) ×3 IMPLANT

## 2017-07-16 NOTE — Discharge Instructions (Signed)
°  Post Anesthesia Home Care Instructions ° °Activity: °Get plenty of rest for the remainder of the day. A responsible individual must stay with you for 24 hours following the procedure.  °For the next 24 hours, DO NOT: °-Drive a car °-Operate machinery °-Drink alcoholic beverages °-Take any medication unless instructed by your physician °-Make any legal decisions or sign important papers. ° °Meals: °Start with liquid foods such as gelatin or soup. Progress to regular foods as tolerated. Avoid greasy, spicy, heavy foods. If nausea and/or vomiting occur, drink only clear liquids until the nausea and/or vomiting subsides. Call your physician if vomiting continues. ° °Special Instructions/Symptoms: °Your throat may feel dry or sore from the anesthesia or the breathing tube placed in your throat during surgery. If this causes discomfort, gargle with warm salt water. The discomfort should disappear within 24 hours. ° °   °Call your surgeon if you experience:  ° °1.  Fever over 101.0. °2.  Inability to urinate. °3.  Nausea and/or vomiting. °4.  Extreme swelling or bruising at the surgical site. °5.  Continued bleeding from the incision. °6.  Increased pain, redness or drainage from the incision. °7.  Problems related to your pain medication. °8.  Any problems and/or concerns °

## 2017-07-16 NOTE — H&P (Signed)
Derek Browning is an 67 y.o. male.   Chief Complaint: abdominal pain and eructations HPI: 67 yo male with persistent burping issues and reflux types disease. He has had multiple scopes and workup without improvement. Recently he also has some epigastric pain and was found to have cholelithiasis with large stones.  Past Medical History:  Diagnosis Date  . Arthritis    Right foot, ankle was shattered   . Cholelithiasis   . Gallstones   . GERD (gastroesophageal reflux disease)   . Hiatal hernia    pts. wife denies  . History of adenomatous polyp of colon   . History of chronic gastritis    PER EGD 09-01-2015 W/ GASTRIC INTESTINAL METAPLASIA , NEGATIVE H. PYLORI  . History of sepsis 12/08/2013   SECONDARY TO UTI  . History of UTI     Past Surgical History:  Procedure Laterality Date  . APPENDECTOMY  1958  . CERVICAL DISC SURGERY  1989   C5  . COLONOSCOPY WITH ESOPHAGOGASTRODUODENOSCOPY (EGD)  09-01-2015   dr s. Havery Moros  . HARDWARE REMOVAL Right 09/15/2014   Procedure: REMOVAL RETAINED HARDWARE RIGHT ANKLE;  Surgeon: Susa Day, MD;  Location: [facility];  Service: Orthopedics;  Laterality: Right;  . ORIF ANKLE FRACTURE Right 11/24/2013   Procedure: OPEN REDUCTION INTERNAL FIXATION (ORIF) RIGHT ANKLE;  Surgeon: [name], MD;  Location: [facility];  Service: Orthopedics;  Laterality: Right;  . ORIF ANKLE FRACTURE Right 02/18/2014   Procedure: SCREW REMOVAL, REVISION OPEN REDUCTION INTERNAL FIXATION RIGHT  ANKLE;  Surgeon: [name], MD;  Location: [facility];  Service: Orthopedics;  Laterality: Right;    History reviewed. No pertinent family history. Social History:  reports that he quit smoking about 17 years ago. His smoking use included cigarettes. He has a 50.00 pack-year smoking history. he has never used smokeless tobacco. He reports that he drinks alcohol. He reports that he does not use drugs.  Allergies:  Allergies  Allergen Reactions  . Cefepime Rash    No swelling or  difficulty breathing    Medications Prior to Admission  Medication Sig Dispense Refill  . HYDROcodone-acetaminophen (NORCO/VICODIN) 5-325 MG tablet Take 2 tablets by mouth every 6 (six) hours as needed. 12 tablet 0  . hydrocortisone (ANUSOL-HC) 25 MG suppository Use one at bedtime for 7 days and then as needed (Patient not taking: Reported on 06/12/2017) 12 suppository 1  . pantoprazole (PROTONIX) 20 MG tablet TAKE 1 TABLET BY MOUTH DAILY 90 tablet 0    No results found for this or any previous visit (from the past 48 hour(s)). No results found.  Review of Systems  Constitutional: Negative for chills and fever.  HENT: Negative for hearing loss.   Eyes: Negative for blurred vision and double vision.  Respiratory: Negative for cough and hemoptysis.   Cardiovascular: Negative for chest pain and palpitations.  Gastrointestinal: Positive for abdominal pain, heartburn, nausea and vomiting.  Genitourinary: Negative for dysuria and urgency.  Musculoskeletal: Negative for myalgias and neck pain.  Skin: Negative for itching and rash.  Neurological: Negative for dizziness, tingling and headaches.  Endo/Heme/Allergies: Does not bruise/bleed easily.  Psychiatric/Behavioral: Negative for depression and suicidal ideas.    Blood pressure (!) 182/92, pulse 93, temperature (!) 97.3 F (36.3 C), temperature source Oral, resp. rate 16, height 5\' 7"  (1.702 m), weight 74.1 kg (163 lb 6.4 oz), SpO2 99 %. Physical Exam  Nursing note and vitals reviewed. Constitutional: He is oriented to person, place, and time. He appears well-developed  and well-nourished.  HENT:  Head: Normocephalic and atraumatic.  Eyes: Conjunctivae and EOM are normal. No scleral icterus.  Neck: Normal range of motion. Neck supple.  Cardiovascular: Normal rate and regular rhythm.  Respiratory: Effort normal and breath sounds normal. He has no wheezes. He has no rales. He exhibits no tenderness.  GI: Soft. He exhibits no distension.  There is tenderness. There is no rebound.  Musculoskeletal: Normal range of motion. He exhibits no edema.  Neurological: He is alert and oriented to person, place, and time.  Skin: Skin is warm and dry.  Psychiatric: He has a normal mood and affect. His behavior is normal.     Assessment/Plan 67 yo male with symptomatic cholelithiasis. We discussed the different diseases that could be in play here and the likelihood of symptoms to improve with removal of the gallbladder. -We discussed the etiology of her pain, we discussed treatment options and recommended surgery. We discussed details of surgery including general anesthesia, laparoscopic approach, identification of cystic duct and common bile duct. Ligation of cystic duct and cystic artery. Possible need for intraoperative cholangiogram or open procedure. Possible risks of common bile duct injury, liver injury, cystic duct leak, bleeding, infection, post-cholecystectomy syndrome. The patient showed good understanding and all questions were answered -enhanced recovery  Mickeal Skinner, MD 07/16/2017, 9:56 AM

## 2017-07-16 NOTE — Anesthesia Procedure Notes (Signed)
Procedure Name: Intubation Date/Time: 07/16/2017 12:05 PM Performed by: Justice Rocher, CRNA Pre-anesthesia Checklist: Patient identified, Emergency Drugs available, Suction available and Patient being monitored Patient Re-evaluated:Patient Re-evaluated prior to induction Oxygen Delivery Method: Circle system utilized Preoxygenation: Pre-oxygenation with 100% oxygen Induction Type: IV induction Ventilation: Mask ventilation without difficulty Laryngoscope Size: Mac and 3 Grade View: Grade II Tube type: Oral Tube size: 7.5 mm Number of attempts: 1 Airway Equipment and Method: Stylet and Oral airway Placement Confirmation: ETT inserted through vocal cords under direct vision,  positive ETCO2 and breath sounds checked- equal and bilateral Secured at: 23 cm Tube secured with: Tape Dental Injury: Teeth and Oropharynx as per pre-operative assessment

## 2017-07-16 NOTE — Progress Notes (Signed)
pts BP elevated 190's - 201/90-105. Hr stable.  Sbp 182 /82. Pt's pain 4/10.  Wife states it has happened before after an ER visit. She took the BP q 2 hrs at home and it went down steadily back to normal..  Dr. Therisa Doyne informed.  No new orders received. Ok to d/c to home. Wife will monitor bp q 2 hrs as before and if his bp remains elevated or increases, she is to bring him back to the ER.  Pt and wife verbalized their understanding.

## 2017-07-16 NOTE — Op Note (Signed)
PATIENT:  Derek Browning  67 y.o. male  PRE-OPERATIVE DIAGNOSIS:  CHRONIC CALCULUS CHOLECYSTITIS  POST-OPERATIVE DIAGNOSIS:  CHRONIC CALCULUS CHOLECYSTITIS  PROCEDURE:  Procedure(s): LAPAROSCOPIC CHOLECYSTECTOMY   SURGEON:  Surgeon(s): Neiko Trivedi, Arta Bruce, MD  ASSISTANT: none  ANESTHESIA:   local and general  Indications for procedure: [name] is a 68 y.o. male with symptoms of Abdominal pain and Nausea and vomiting consistent with gallbladder disease, Confirmed by Ultrasound.  Description of procedure: The patient was brought into the operative suite, placed supine. Anesthesia was administered with endotracheal tube. Patient was strapped in place and foot board was secured. All pressure points were offloaded by foam padding. The patient was prepped and draped in the usual sterile fashion.  A small incision was made to the right of the umbilicus. A 40mm trocar was inserted into the peritoneal cavity with optical entry. Pneumoperitoneum was applied with high flow low pressure. 2 4mm trocars were placed in the RUQ. A 38mm trocar was placed in the subxiphoid space. All trocars sites were first anesthesized with marcaine with epinephrine in the subcutaneous and preperitoneal layers. Next the patient was placed in reverse trendelenberg. The gallbladder was thin walled and blue in color.  The gallbladder was retracted cephalad and lateral. The peritoneum was reflected off the infundibulum working lateral to medial. The cystic duct and cystic artery were identified and further dissection revealed a critical view. The cystic duct and cystic artery were doubly clipped and ligated.   The gallbladder was removed off the liver bed with cautery. The Gallbladder was placed in a specimen bag. The gallbladder fossa was irrigated and hemostasis was applied with cautery. The gallbladder was removed via the 16mm trocar. No dilation was required for removal, therefore no fascial closure was performed. The  stomach and hiatus were inspected and appeared normal without focal signs of inflammation.Pneumoperitoneum was removed, all trocar were removed. All incisions were closed with 4-0 monocryl subcuticular stitch. The patient woke from anesthesia and was brought to PACU in stable condition. All counts were correct  Findings: normal gallbladder anatomy, no anatomic abnormality of the stomach or hiatus.  Specimen: gallbladder  Blood loss: Total I/O In: 300 [I.V.:300] Out: 0  ml  Local anesthesia: 20 ml marcaine  Complications: none  PLAN OF CARE: Discharge to home after PACU  PATIENT DISPOSITION:  PACU - hemodynamically stable.  Gurney Maxin, M.D. General, Bariatric, & Minimally Invasive Surgery [facility]

## 2017-07-16 NOTE — Anesthesia Preprocedure Evaluation (Signed)
Anesthesia Evaluation  Patient identified by MRN, date of birth, ID band Patient awake    Reviewed: Allergy & Precautions, NPO status , Patient's Chart, lab work & pertinent test results  Airway Mallampati: II       Dental  (+) Lower Dentures, Upper Dentures   Pulmonary former smoker,    Pulmonary exam normal breath sounds clear to auscultation       Cardiovascular negative cardio ROS Normal cardiovascular exam Rhythm:Regular Rate:Normal     Neuro/Psych negative psych ROS   GI/Hepatic Neg liver ROS, GERD  Medicated and Controlled,  Endo/Other  negative endocrine ROS  Renal/GU negative Renal ROS  negative genitourinary   Musculoskeletal   Abdominal Normal abdominal exam  (+)   Peds  Hematology negative hematology ROS (+)   Anesthesia Other Findings   Reproductive/Obstetrics                             Anesthesia Physical Anesthesia Plan  ASA: II  Anesthesia Plan: General   Post-op Pain Management:    Induction: Intravenous  PONV Risk Score and Plan: 3 and Ondansetron, Dexamethasone, Midazolam and Treatment may vary due to age or medical condition  Airway Management Planned: Oral ETT  Additional Equipment:   Intra-op Plan:   Post-operative Plan: Extubation in OR  Informed Consent: I have reviewed the patients History and Physical, chart, labs and discussed the procedure including the risks, benefits and alternatives for the proposed anesthesia with the patient or authorized representative who has indicated his/her understanding and acceptance.   Dental advisory given  Plan Discussed with: CRNA and Surgeon  Anesthesia Plan Comments:         Anesthesia Quick Evaluation

## 2017-07-16 NOTE — Transfer of Care (Signed)
Immediate Anesthesia Transfer of Care Note  Patient: Derek Browning  Procedure(s) Performed: Procedure(s) (LRB): LAPAROSCOPIC CHOLECYSTECTOMY (N/A)  Patient Location: PACU  Anesthesia Type: General  Level of Consciousness: awake, sedated, patient cooperative and responds to stimulation  Airway & Oxygen Therapy: Patient Spontanous Breathing and Patient connected to NCO2  Post-op Assessment: Report given to PACU RN, Post -op Vital signs reviewed and stable and Patient moving all extremities  Post vital signs: Reviewed and stable  Complications: No apparent anesthesia complications

## 2017-07-16 NOTE — Anesthesia Postprocedure Evaluation (Signed)
Anesthesia Post Note  Patient: Derek Browning  Procedure(s) Performed: LAPAROSCOPIC CHOLECYSTECTOMY (N/A Abdomen)     Patient location during evaluation: PACU Anesthesia Type: General Level of consciousness: sedated Pain management: pain level controlled Vital Signs Assessment: post-procedure vital signs reviewed and stable Respiratory status: spontaneous breathing Cardiovascular status: stable Postop Assessment: no apparent nausea or vomiting Anesthetic complications: no    Last Vitals:  Vitals:   07/16/17 1300 07/16/17 1315  BP: (!) 157/78 (!) 152/92  Pulse: 69 63  Resp: 13 18  Temp: (!) 36.4 C   SpO2: 90% 92%    Last Pain:  Vitals:   07/16/17 1021  TempSrc:   PainSc: 3    Pain Goal: Patients Stated Pain Goal: 2 (07/16/17 1021)               [name],[name]

## 2017-07-17 ENCOUNTER — Encounter (HOSPITAL_BASED_OUTPATIENT_CLINIC_OR_DEPARTMENT_OTHER): Payer: Self-pay | Admitting: General Surgery

## 2019-02-12 IMAGING — RF DG ESOPHAGUS
12 of 21 series · 14 of 24 positions shown · non-contrast
Comparison: None.

CLINICAL DATA: 65-year-old male with belching and mid abdominal
pain for the past year. Recent endoscopy with esophagitis distal
esophagus. Initial encounter.

EXAM:
ESOPHOGRAM / BARIUM SWALLOW / BARIUM TABLET STUDY
TECHNIQUE: Combined double contrast and single contrast examination performed
using effervescent crystals, thick barium liquid, and thin barium
liquid. The patient was observed with fluoroscopy swallowing a 13 mm
barium sulphate tablet.
FLUOROSCOPY TIME:  Fluoroscopy Time:  1 minutes and 48 seconds.
Radiation Exposure Index (if provided by the fluoroscopic device):
47.6 mGy.

[Series 1: cp_standard · 0.26mm/px · 1 of 1 slices shown (1 of 11)]
[im 1/1]
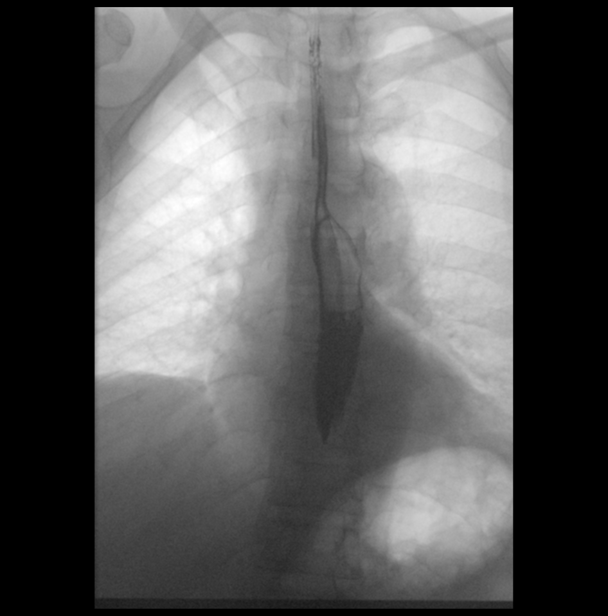

[Series 3: cp_standard · 0.26mm/px · 1 of 1 slices shown (2 of 11)]
[im 1/1]
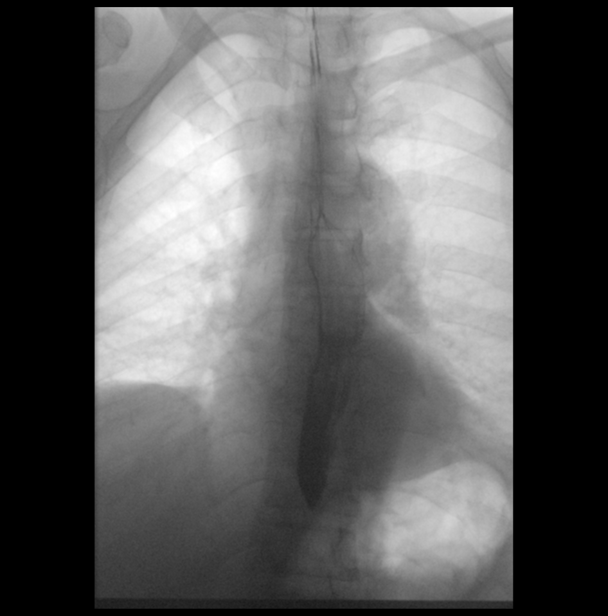

[Series 6: cp_standard · 0.26mm/px · 1 of 1 slices shown (3 of 11)]
[im 1/1]
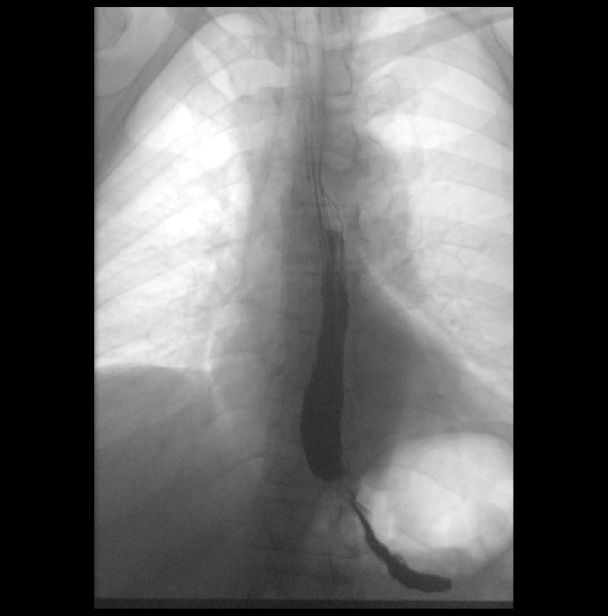

[Series 8: cp_standard · 0.26mm/px · 1 of 1 slices shown (4 of 11)]
[im 1/1]
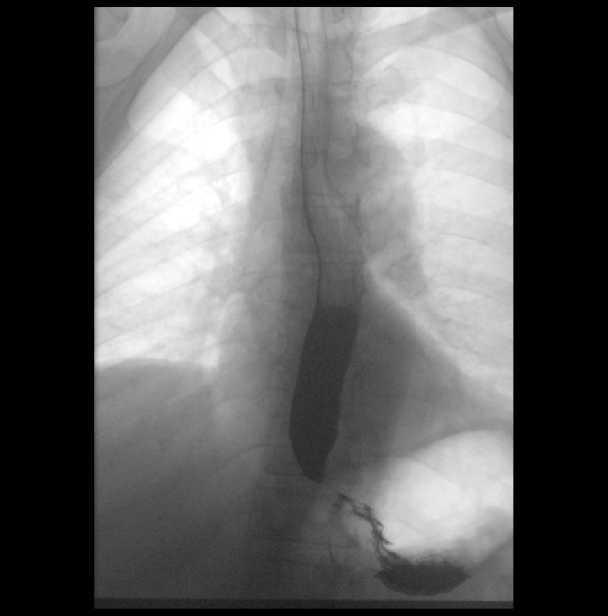

[Series 9: cp_standard · 0.26mm/px · 1 of 1 slices shown (5 of 11)]
[im 1/1]
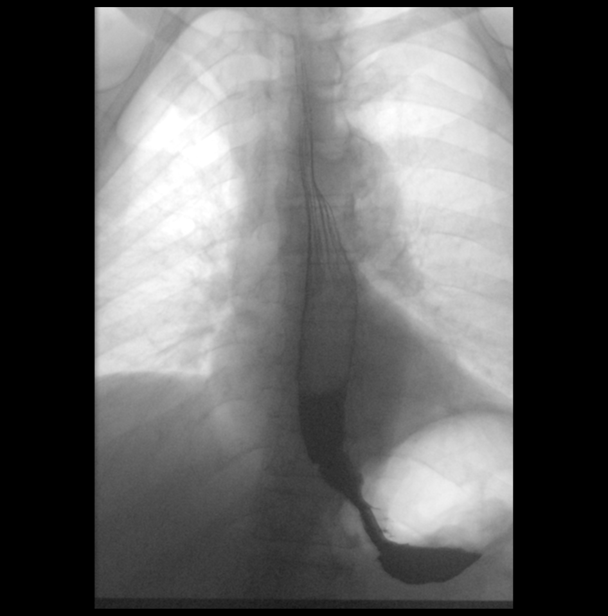

[Series 11: cp_standard · 0.27mm/px · 1 of 1 slices shown (6 of 11)]
[im 1/1]
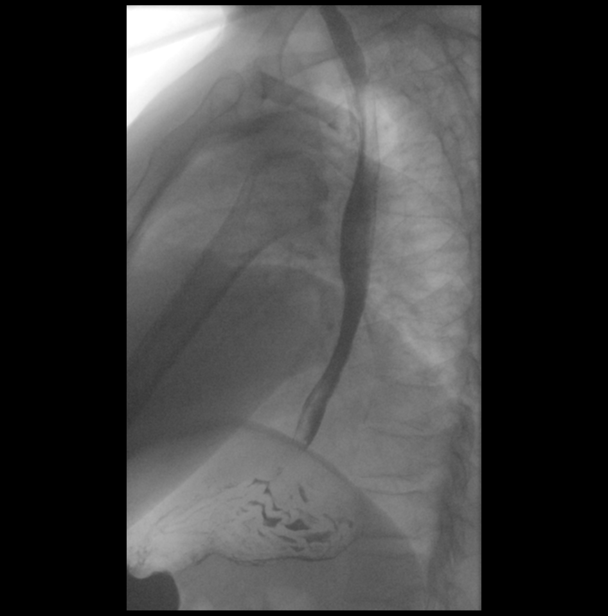

[Series 13: cp_standard · 0.27mm/px · 1 of 1 slices shown (7 of 11)]
[im 1/1]
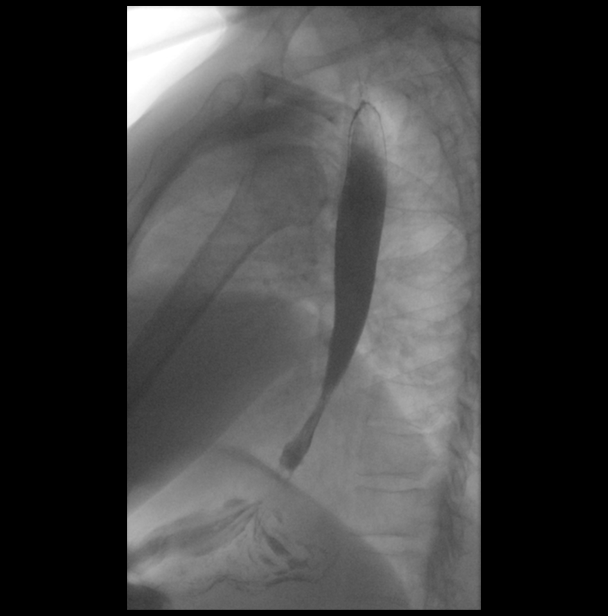

[Series 15: cp_standard · 0.27mm/px · 1 of 1 slices shown (8 of 11)]
[im 1/1]
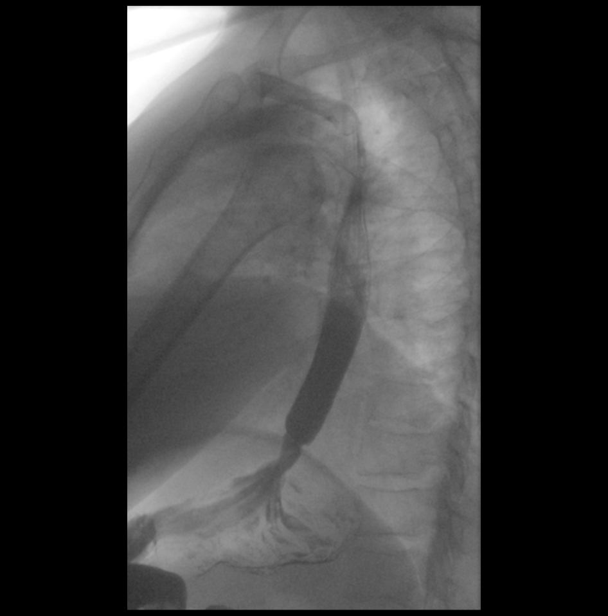

[Series 17: cp_standard · 0.27mm/px · 1 of 1 slices shown (9 of 11)]
[im 1/1]
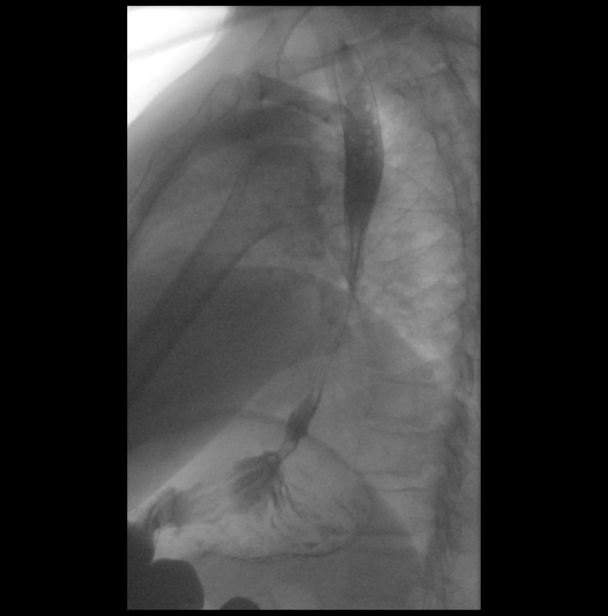

[Series 19: fluoro_barium 2fps_bw · 0.18mm/px · 3 of 16 frames shown]
[frame 3/16]
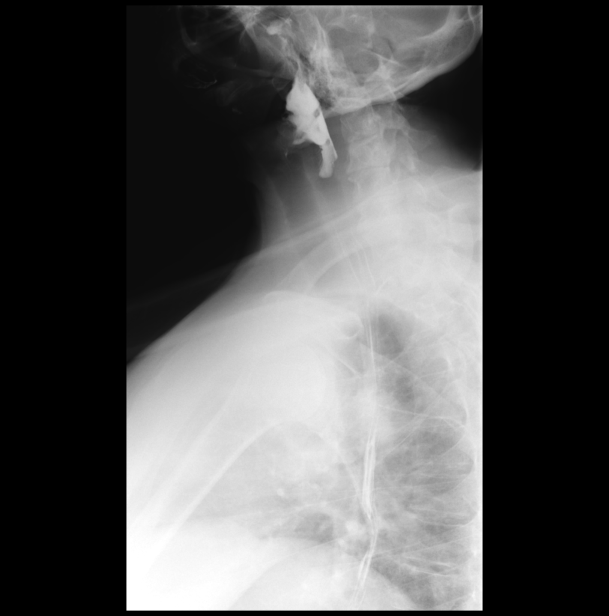
[frame 9/16]
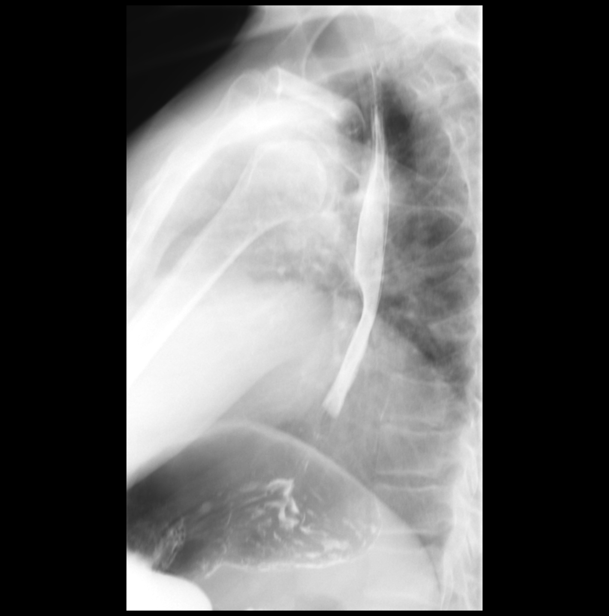
[frame 14/16]
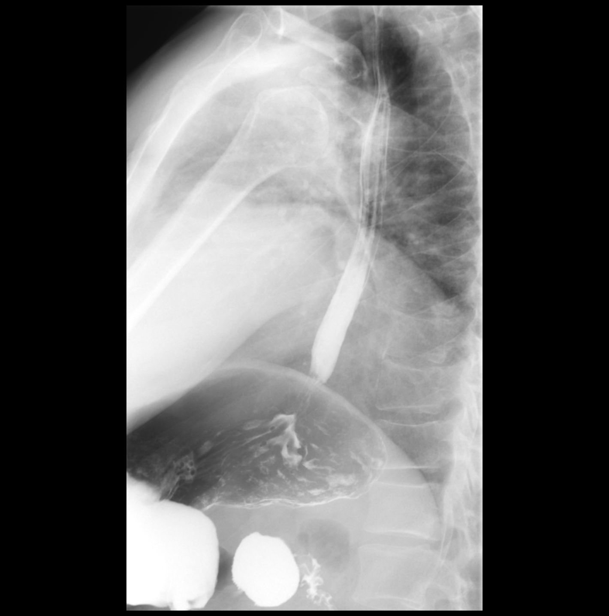

[Series 22: cp_standard · 0.28mm/px · 1 of 1 slices shown (10 of 11)]
[im 1/1]
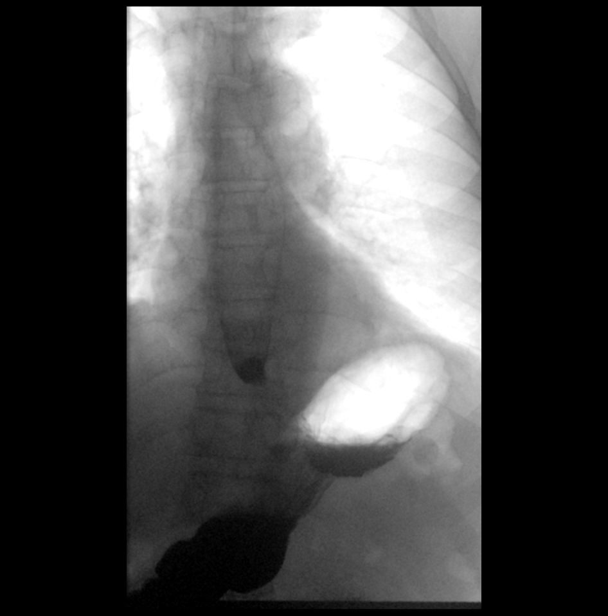

[Series 24: cp_standard · 0.28mm/px · 1 of 1 slices shown (11 of 11)]
[im 1/1]
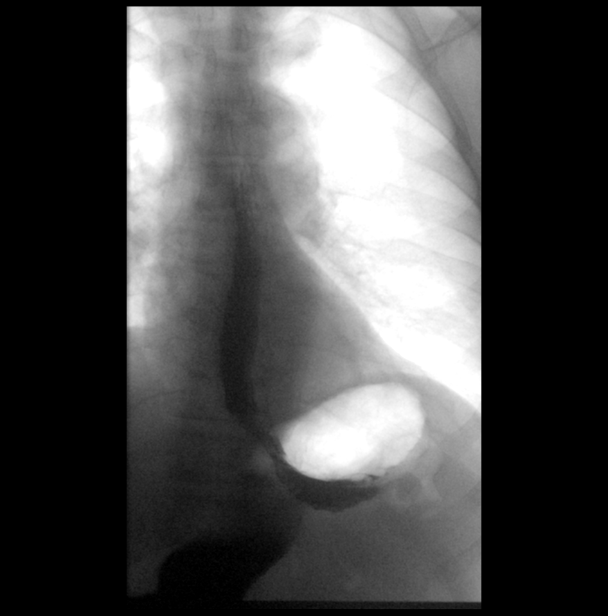

[14 of 24 positions shown; findings below may reference images not displayed]

FINDINGS: Normal primary esophageal stripping wave.

Very small sliding-type hiatal hernia with mild narrowing along the
superior margin where ingested 13 mm barium tablet becomes
temporarily lodged but clears immediately with subsequent
swallowing.

The endoscopic detected esophagitis is not appreciated on present
exam.

No reflux with change of patient's position.
IMPRESSION: Very small sliding-type hiatal hernia with mild narrowing along the
superior margin where ingested 13 mm barium tablet becomes
temporarily lodged but clears immediately with subsequent
swallowing.

Endoscopic detected esophagitis is not appreciated on present exam.

## 2021-02-13 ENCOUNTER — Encounter: Payer: Self-pay | Admitting: Gastroenterology
